# Patient Record
Sex: Male | Born: 1935 | Race: White | Hispanic: No | Marital: Married | Smoking: Never smoker
Health system: Southern US, Community
[De-identification: ages and names within clinical notes are randomized; demographics above are authoritative.]

## PROBLEM LIST (undated history)

## (undated) DIAGNOSIS — R011 Cardiac murmur, unspecified: Secondary | ICD-10-CM

## (undated) DIAGNOSIS — I4891 Unspecified atrial fibrillation: Secondary | ICD-10-CM

## (undated) DIAGNOSIS — I499 Cardiac arrhythmia, unspecified: Secondary | ICD-10-CM

## (undated) DIAGNOSIS — G4730 Sleep apnea, unspecified: Secondary | ICD-10-CM

## (undated) DIAGNOSIS — I10 Essential (primary) hypertension: Secondary | ICD-10-CM

## (undated) DIAGNOSIS — N400 Enlarged prostate without lower urinary tract symptoms: Secondary | ICD-10-CM

## (undated) HISTORY — DX: Cardiac murmur, unspecified: R01.1

## (undated) HISTORY — DX: Cardiac arrhythmia, unspecified: I49.9

## (undated) HISTORY — DX: Benign prostatic hyperplasia without lower urinary tract symptoms: N40.0

## (undated) HISTORY — PX: HERNIA REPAIR: SHX51

## (undated) HISTORY — DX: Unspecified atrial fibrillation: I48.91

---

## 2005-01-11 ENCOUNTER — Inpatient Hospital Stay: Payer: Self-pay | Admitting: Internal Medicine

## 2005-04-13 ENCOUNTER — Inpatient Hospital Stay: Payer: Self-pay | Admitting: Internal Medicine

## 2007-05-07 ENCOUNTER — Ambulatory Visit: Payer: Self-pay | Admitting: Urology

## 2011-05-22 ENCOUNTER — Ambulatory Visit: Payer: Self-pay | Admitting: Family Medicine

## 2011-12-24 ENCOUNTER — Ambulatory Visit: Payer: Self-pay | Admitting: Internal Medicine

## 2012-08-06 ENCOUNTER — Emergency Department: Payer: Self-pay | Admitting: Emergency Medicine

## 2013-04-18 ENCOUNTER — Emergency Department: Payer: Self-pay | Admitting: Emergency Medicine

## 2013-04-18 LAB — COMPREHENSIVE METABOLIC PANEL
Alkaline Phosphatase: 103 U/L (ref 50–136)
BUN: 16 mg/dL (ref 7–18)
Bilirubin,Total: 1.1 mg/dL — ABNORMAL HIGH (ref 0.2–1.0)
Calcium, Total: 8.5 mg/dL (ref 8.5–10.1)
Creatinine: 1.02 mg/dL (ref 0.60–1.30)
EGFR (African American): >60
EGFR (Non-African Amer.): >60
Glucose: 152 mg/dL — ABNORMAL HIGH (ref 65–99)
Potassium: 3.7 mmol/L (ref 3.5–5.1)
SGOT(AST): 24 U/L (ref 15–37)
SGPT (ALT): 24 U/L (ref 12–78)
Total Protein: 6.7 g/dL (ref 6.4–8.2)

## 2013-04-18 LAB — TROPONIN I: Troponin-I: 0.03 ng/mL

## 2013-04-18 LAB — CBC
HCT: 47.1 % (ref 40.0–52.0)
MCH: 30.6 pg (ref 26.0–34.0)
MCHC: 34.8 g/dL (ref 32.0–36.0)
RBC: 5.35 10*6/uL (ref 4.40–5.90)
RDW: 13.4 % (ref 11.5–14.5)

## 2013-04-18 LAB — PRO B NATRIURETIC PEPTIDE: B-Type Natriuretic Peptide: 125 pg/mL (ref 0–450)

## 2013-05-06 DIAGNOSIS — N401 Enlarged prostate with lower urinary tract symptoms: Secondary | ICD-10-CM | POA: Insufficient documentation

## 2013-05-06 DIAGNOSIS — N411 Chronic prostatitis: Secondary | ICD-10-CM | POA: Insufficient documentation

## 2013-05-06 DIAGNOSIS — R972 Elevated prostate specific antigen [PSA]: Secondary | ICD-10-CM | POA: Insufficient documentation

## 2013-05-06 DIAGNOSIS — N529 Male erectile dysfunction, unspecified: Secondary | ICD-10-CM | POA: Insufficient documentation

## 2013-05-06 DIAGNOSIS — R339 Retention of urine, unspecified: Secondary | ICD-10-CM | POA: Insufficient documentation

## 2013-05-06 DIAGNOSIS — N138 Other obstructive and reflux uropathy: Secondary | ICD-10-CM | POA: Insufficient documentation

## 2014-06-23 ENCOUNTER — Emergency Department: Payer: Self-pay | Admitting: Emergency Medicine

## 2014-06-23 LAB — CBC
HCT: 49.1 % (ref 40.0–52.0)
HGB: 16.5 g/dL (ref 13.0–18.0)
MCH: 30.9 pg (ref 26.0–34.0)
MCHC: 33.6 g/dL (ref 32.0–36.0)
MCV: 92 fL (ref 80–100)
Platelet: 140 10*3/uL — ABNORMAL LOW (ref 150–440)
RBC: 5.35 10*6/uL (ref 4.40–5.90)
RDW: 13.7 % (ref 11.5–14.5)
WBC: 16.1 10*3/uL — AB (ref 3.8–10.6)

## 2014-06-23 LAB — COMPREHENSIVE METABOLIC PANEL
ANION GAP: 9 (ref 7–16)
AST: 15 U/L (ref 15–37)
Albumin: 3.6 g/dL (ref 3.4–5.0)
Alkaline Phosphatase: 85 U/L
BUN: 18 mg/dL (ref 7–18)
Bilirubin,Total: 3.9 mg/dL — ABNORMAL HIGH (ref 0.2–1.0)
CALCIUM: 8.7 mg/dL (ref 8.5–10.1)
CHLORIDE: 107 mmol/L (ref 98–107)
Co2: 24 mmol/L (ref 21–32)
Creatinine: 1.11 mg/dL (ref 0.60–1.30)
EGFR (African American): >60
Glucose: 126 mg/dL — ABNORMAL HIGH (ref 65–99)
OSMOLALITY: 283 (ref 275–301)
Potassium: 3.3 mmol/L — ABNORMAL LOW (ref 3.5–5.1)
SGPT (ALT): 23 U/L
Sodium: 140 mmol/L (ref 136–145)
Total Protein: 6.8 g/dL (ref 6.4–8.2)

## 2014-06-23 LAB — URINALYSIS, COMPLETE
BILIRUBIN, UR: NEGATIVE
GLUCOSE, UR: NEGATIVE mg/dL (ref 0–75)
KETONE: NEGATIVE
Nitrite: NEGATIVE
Ph: 5 (ref 4.5–8.0)
Protein: 30
RBC,UR: 60 /HPF (ref 0–5)
SQUAMOUS EPITHELIAL: NONE SEEN
Specific Gravity: 1.012 (ref 1.003–1.030)

## 2014-07-04 ENCOUNTER — Ambulatory Visit: Payer: Self-pay | Admitting: Internal Medicine

## 2014-07-13 DIAGNOSIS — N3001 Acute cystitis with hematuria: Secondary | ICD-10-CM | POA: Insufficient documentation

## 2014-08-18 DIAGNOSIS — R3129 Other microscopic hematuria: Secondary | ICD-10-CM | POA: Insufficient documentation

## 2014-08-18 DIAGNOSIS — R102 Pelvic and perineal pain: Secondary | ICD-10-CM | POA: Insufficient documentation

## 2014-08-18 DIAGNOSIS — R1024 Suprapubic pain: Secondary | ICD-10-CM | POA: Insufficient documentation

## 2014-08-21 ENCOUNTER — Emergency Department: Payer: Self-pay | Admitting: Emergency Medicine

## 2014-08-21 LAB — URINALYSIS, COMPLETE
Bilirubin,UR: NEGATIVE
GLUCOSE, UR: NEGATIVE mg/dL (ref 0–75)
KETONE: NEGATIVE
NITRITE: NEGATIVE
PROTEIN: NEGATIVE
Ph: 7 (ref 4.5–8.0)
SQUAMOUS EPITHELIAL: NONE SEEN
Specific Gravity: 1.005 (ref 1.003–1.030)

## 2014-08-21 LAB — COMPREHENSIVE METABOLIC PANEL
ANION GAP: 6 — AB (ref 7–16)
AST: 16 U/L (ref 15–37)
Albumin: 3 g/dL — ABNORMAL LOW (ref 3.4–5.0)
Alkaline Phosphatase: 82 U/L
BUN: 11 mg/dL (ref 7–18)
Bilirubin,Total: 1.4 mg/dL — ABNORMAL HIGH (ref 0.2–1.0)
CHLORIDE: 105 mmol/L (ref 98–107)
CO2: 29 mmol/L (ref 21–32)
Calcium, Total: 7.9 mg/dL — ABNORMAL LOW (ref 8.5–10.1)
Creatinine: 1.14 mg/dL (ref 0.60–1.30)
EGFR (African American): >60
Glucose: 121 mg/dL — ABNORMAL HIGH (ref 65–99)
Osmolality: 280 (ref 275–301)
POTASSIUM: 3.6 mmol/L (ref 3.5–5.1)
SGPT (ALT): 19 U/L
SODIUM: 140 mmol/L (ref 136–145)
TOTAL PROTEIN: 6.6 g/dL (ref 6.4–8.2)

## 2014-08-21 LAB — CBC
HCT: 46.6 % (ref 40.0–52.0)
HGB: 16.1 g/dL (ref 13.0–18.0)
MCH: 30.6 pg (ref 26.0–34.0)
MCHC: 34.5 g/dL (ref 32.0–36.0)
MCV: 89 fL (ref 80–100)
Platelet: 159 10*3/uL (ref 150–440)
RBC: 5.25 10*6/uL (ref 4.40–5.90)
RDW: 13.4 % (ref 11.5–14.5)
WBC: 5.1 10*3/uL (ref 3.8–10.6)

## 2014-08-21 LAB — TROPONIN I: Troponin-I: <0.02 ng/mL

## 2014-08-25 DIAGNOSIS — N281 Cyst of kidney, acquired: Secondary | ICD-10-CM | POA: Insufficient documentation

## 2014-08-25 DIAGNOSIS — K469 Unspecified abdominal hernia without obstruction or gangrene: Secondary | ICD-10-CM | POA: Insufficient documentation

## 2015-12-14 DIAGNOSIS — H2513 Age-related nuclear cataract, bilateral: Secondary | ICD-10-CM | POA: Diagnosis not present

## 2015-12-21 DIAGNOSIS — S60450A Superficial foreign body of right index finger, initial encounter: Secondary | ICD-10-CM | POA: Diagnosis not present

## 2016-05-24 DIAGNOSIS — R011 Cardiac murmur, unspecified: Secondary | ICD-10-CM | POA: Diagnosis not present

## 2016-05-24 DIAGNOSIS — R42 Dizziness and giddiness: Secondary | ICD-10-CM | POA: Diagnosis not present

## 2016-05-24 DIAGNOSIS — E784 Other hyperlipidemia: Secondary | ICD-10-CM | POA: Diagnosis not present

## 2016-05-24 DIAGNOSIS — J329 Chronic sinusitis, unspecified: Secondary | ICD-10-CM | POA: Diagnosis not present

## 2016-05-24 DIAGNOSIS — I480 Paroxysmal atrial fibrillation: Secondary | ICD-10-CM | POA: Diagnosis not present

## 2016-05-24 DIAGNOSIS — G4733 Obstructive sleep apnea (adult) (pediatric): Secondary | ICD-10-CM | POA: Diagnosis not present

## 2016-05-24 DIAGNOSIS — I10 Essential (primary) hypertension: Secondary | ICD-10-CM | POA: Diagnosis not present

## 2016-05-28 DIAGNOSIS — L570 Actinic keratosis: Secondary | ICD-10-CM | POA: Diagnosis not present

## 2016-05-28 DIAGNOSIS — Z08 Encounter for follow-up examination after completed treatment for malignant neoplasm: Secondary | ICD-10-CM | POA: Diagnosis not present

## 2016-05-28 DIAGNOSIS — X32XXXA Exposure to sunlight, initial encounter: Secondary | ICD-10-CM | POA: Diagnosis not present

## 2016-05-28 DIAGNOSIS — Z85828 Personal history of other malignant neoplasm of skin: Secondary | ICD-10-CM | POA: Diagnosis not present

## 2016-05-28 DIAGNOSIS — L821 Other seborrheic keratosis: Secondary | ICD-10-CM | POA: Diagnosis not present

## 2016-06-07 DIAGNOSIS — R0602 Shortness of breath: Secondary | ICD-10-CM | POA: Diagnosis not present

## 2016-06-07 DIAGNOSIS — R011 Cardiac murmur, unspecified: Secondary | ICD-10-CM | POA: Diagnosis not present

## 2016-06-12 DIAGNOSIS — N411 Chronic prostatitis: Secondary | ICD-10-CM | POA: Diagnosis not present

## 2016-06-12 DIAGNOSIS — N138 Other obstructive and reflux uropathy: Secondary | ICD-10-CM | POA: Diagnosis not present

## 2016-06-12 DIAGNOSIS — R3129 Other microscopic hematuria: Secondary | ICD-10-CM | POA: Diagnosis not present

## 2016-06-12 DIAGNOSIS — N401 Enlarged prostate with lower urinary tract symptoms: Secondary | ICD-10-CM | POA: Diagnosis not present

## 2016-06-12 DIAGNOSIS — R339 Retention of urine, unspecified: Secondary | ICD-10-CM | POA: Diagnosis not present

## 2016-06-12 DIAGNOSIS — R972 Elevated prostate specific antigen [PSA]: Secondary | ICD-10-CM | POA: Diagnosis not present

## 2016-07-19 DIAGNOSIS — Z0000 Encounter for general adult medical examination without abnormal findings: Secondary | ICD-10-CM | POA: Diagnosis not present

## 2016-07-19 DIAGNOSIS — R7309 Other abnormal glucose: Secondary | ICD-10-CM | POA: Diagnosis not present

## 2016-07-19 DIAGNOSIS — I480 Paroxysmal atrial fibrillation: Secondary | ICD-10-CM | POA: Diagnosis not present

## 2016-07-19 DIAGNOSIS — E782 Mixed hyperlipidemia: Secondary | ICD-10-CM | POA: Diagnosis not present

## 2016-07-19 DIAGNOSIS — G4733 Obstructive sleep apnea (adult) (pediatric): Secondary | ICD-10-CM | POA: Diagnosis not present

## 2016-07-19 DIAGNOSIS — Z79899 Other long term (current) drug therapy: Secondary | ICD-10-CM | POA: Diagnosis not present

## 2016-07-19 DIAGNOSIS — I10 Essential (primary) hypertension: Secondary | ICD-10-CM | POA: Diagnosis not present

## 2016-07-19 DIAGNOSIS — Z1211 Encounter for screening for malignant neoplasm of colon: Secondary | ICD-10-CM | POA: Diagnosis not present

## 2016-07-23 DIAGNOSIS — E782 Mixed hyperlipidemia: Secondary | ICD-10-CM | POA: Diagnosis not present

## 2016-07-23 DIAGNOSIS — R7309 Other abnormal glucose: Secondary | ICD-10-CM | POA: Diagnosis not present

## 2016-07-23 DIAGNOSIS — I10 Essential (primary) hypertension: Secondary | ICD-10-CM | POA: Diagnosis not present

## 2016-07-23 DIAGNOSIS — Z79899 Other long term (current) drug therapy: Secondary | ICD-10-CM | POA: Diagnosis not present

## 2016-07-26 DIAGNOSIS — Z1211 Encounter for screening for malignant neoplasm of colon: Secondary | ICD-10-CM | POA: Diagnosis not present

## 2016-08-05 DIAGNOSIS — G4733 Obstructive sleep apnea (adult) (pediatric): Secondary | ICD-10-CM | POA: Diagnosis not present

## 2016-08-05 DIAGNOSIS — I10 Essential (primary) hypertension: Secondary | ICD-10-CM | POA: Diagnosis not present

## 2016-08-15 DIAGNOSIS — G4733 Obstructive sleep apnea (adult) (pediatric): Secondary | ICD-10-CM | POA: Diagnosis not present

## 2016-08-15 DIAGNOSIS — I10 Essential (primary) hypertension: Secondary | ICD-10-CM | POA: Diagnosis not present

## 2016-08-18 DIAGNOSIS — G4733 Obstructive sleep apnea (adult) (pediatric): Secondary | ICD-10-CM | POA: Diagnosis not present

## 2016-09-15 DIAGNOSIS — I10 Essential (primary) hypertension: Secondary | ICD-10-CM | POA: Diagnosis not present

## 2016-09-15 DIAGNOSIS — G4733 Obstructive sleep apnea (adult) (pediatric): Secondary | ICD-10-CM | POA: Diagnosis not present

## 2016-10-03 ENCOUNTER — Ambulatory Visit: Payer: PPO | Attending: Specialist

## 2016-10-03 DIAGNOSIS — G4733 Obstructive sleep apnea (adult) (pediatric): Secondary | ICD-10-CM | POA: Insufficient documentation

## 2016-10-03 DIAGNOSIS — G4761 Periodic limb movement disorder: Secondary | ICD-10-CM | POA: Insufficient documentation

## 2016-10-15 DIAGNOSIS — G4733 Obstructive sleep apnea (adult) (pediatric): Secondary | ICD-10-CM | POA: Diagnosis not present

## 2016-10-15 DIAGNOSIS — I10 Essential (primary) hypertension: Secondary | ICD-10-CM | POA: Diagnosis not present

## 2016-11-15 DIAGNOSIS — I10 Essential (primary) hypertension: Secondary | ICD-10-CM | POA: Diagnosis not present

## 2016-11-15 DIAGNOSIS — G4733 Obstructive sleep apnea (adult) (pediatric): Secondary | ICD-10-CM | POA: Diagnosis not present

## 2016-12-16 DIAGNOSIS — G4733 Obstructive sleep apnea (adult) (pediatric): Secondary | ICD-10-CM | POA: Diagnosis not present

## 2016-12-16 DIAGNOSIS — E782 Mixed hyperlipidemia: Secondary | ICD-10-CM | POA: Diagnosis not present

## 2016-12-16 DIAGNOSIS — I10 Essential (primary) hypertension: Secondary | ICD-10-CM | POA: Diagnosis not present

## 2017-01-13 DIAGNOSIS — I10 Essential (primary) hypertension: Secondary | ICD-10-CM | POA: Diagnosis not present

## 2017-01-13 DIAGNOSIS — G4733 Obstructive sleep apnea (adult) (pediatric): Secondary | ICD-10-CM | POA: Diagnosis not present

## 2017-02-13 DIAGNOSIS — I10 Essential (primary) hypertension: Secondary | ICD-10-CM | POA: Diagnosis not present

## 2017-02-13 DIAGNOSIS — G4733 Obstructive sleep apnea (adult) (pediatric): Secondary | ICD-10-CM | POA: Diagnosis not present

## 2017-03-15 DIAGNOSIS — I10 Essential (primary) hypertension: Secondary | ICD-10-CM | POA: Diagnosis not present

## 2017-03-15 DIAGNOSIS — G4733 Obstructive sleep apnea (adult) (pediatric): Secondary | ICD-10-CM | POA: Diagnosis not present

## 2017-04-15 DIAGNOSIS — G4733 Obstructive sleep apnea (adult) (pediatric): Secondary | ICD-10-CM | POA: Diagnosis not present

## 2017-04-15 DIAGNOSIS — I10 Essential (primary) hypertension: Secondary | ICD-10-CM | POA: Diagnosis not present

## 2017-05-15 DIAGNOSIS — I10 Essential (primary) hypertension: Secondary | ICD-10-CM | POA: Diagnosis not present

## 2017-05-15 DIAGNOSIS — G4733 Obstructive sleep apnea (adult) (pediatric): Secondary | ICD-10-CM | POA: Diagnosis not present

## 2017-05-28 DIAGNOSIS — X32XXXA Exposure to sunlight, initial encounter: Secondary | ICD-10-CM | POA: Diagnosis not present

## 2017-05-28 DIAGNOSIS — D2272 Melanocytic nevi of left lower limb, including hip: Secondary | ICD-10-CM | POA: Diagnosis not present

## 2017-05-28 DIAGNOSIS — R208 Other disturbances of skin sensation: Secondary | ICD-10-CM | POA: Diagnosis not present

## 2017-05-28 DIAGNOSIS — D225 Melanocytic nevi of trunk: Secondary | ICD-10-CM | POA: Diagnosis not present

## 2017-05-28 DIAGNOSIS — D485 Neoplasm of uncertain behavior of skin: Secondary | ICD-10-CM | POA: Diagnosis not present

## 2017-05-28 DIAGNOSIS — D2261 Melanocytic nevi of right upper limb, including shoulder: Secondary | ICD-10-CM | POA: Diagnosis not present

## 2017-05-28 DIAGNOSIS — Z85828 Personal history of other malignant neoplasm of skin: Secondary | ICD-10-CM | POA: Diagnosis not present

## 2017-05-28 DIAGNOSIS — B079 Viral wart, unspecified: Secondary | ICD-10-CM | POA: Diagnosis not present

## 2017-05-28 DIAGNOSIS — L570 Actinic keratosis: Secondary | ICD-10-CM | POA: Diagnosis not present

## 2017-05-29 DIAGNOSIS — J329 Chronic sinusitis, unspecified: Secondary | ICD-10-CM | POA: Diagnosis not present

## 2017-05-29 DIAGNOSIS — R0602 Shortness of breath: Secondary | ICD-10-CM | POA: Diagnosis not present

## 2017-05-29 DIAGNOSIS — R011 Cardiac murmur, unspecified: Secondary | ICD-10-CM | POA: Diagnosis not present

## 2017-05-29 DIAGNOSIS — E784 Other hyperlipidemia: Secondary | ICD-10-CM | POA: Diagnosis not present

## 2017-05-29 DIAGNOSIS — G4733 Obstructive sleep apnea (adult) (pediatric): Secondary | ICD-10-CM | POA: Diagnosis not present

## 2017-05-29 DIAGNOSIS — I480 Paroxysmal atrial fibrillation: Secondary | ICD-10-CM | POA: Diagnosis not present

## 2017-05-29 DIAGNOSIS — I10 Essential (primary) hypertension: Secondary | ICD-10-CM | POA: Diagnosis not present

## 2017-05-29 DIAGNOSIS — R42 Dizziness and giddiness: Secondary | ICD-10-CM | POA: Diagnosis not present

## 2017-06-15 DIAGNOSIS — G4733 Obstructive sleep apnea (adult) (pediatric): Secondary | ICD-10-CM | POA: Diagnosis not present

## 2017-06-15 DIAGNOSIS — I10 Essential (primary) hypertension: Secondary | ICD-10-CM | POA: Diagnosis not present

## 2017-07-16 DIAGNOSIS — G4733 Obstructive sleep apnea (adult) (pediatric): Secondary | ICD-10-CM | POA: Diagnosis not present

## 2017-07-16 DIAGNOSIS — I10 Essential (primary) hypertension: Secondary | ICD-10-CM | POA: Diagnosis not present

## 2017-07-29 DIAGNOSIS — R3129 Other microscopic hematuria: Secondary | ICD-10-CM | POA: Diagnosis not present

## 2017-07-29 DIAGNOSIS — N138 Other obstructive and reflux uropathy: Secondary | ICD-10-CM | POA: Diagnosis not present

## 2017-07-29 DIAGNOSIS — R339 Retention of urine, unspecified: Secondary | ICD-10-CM | POA: Diagnosis not present

## 2017-07-29 DIAGNOSIS — R972 Elevated prostate specific antigen [PSA]: Secondary | ICD-10-CM | POA: Diagnosis not present

## 2017-07-29 DIAGNOSIS — N411 Chronic prostatitis: Secondary | ICD-10-CM | POA: Diagnosis not present

## 2017-07-29 DIAGNOSIS — N401 Enlarged prostate with lower urinary tract symptoms: Secondary | ICD-10-CM | POA: Diagnosis not present

## 2017-08-15 DIAGNOSIS — G4733 Obstructive sleep apnea (adult) (pediatric): Secondary | ICD-10-CM | POA: Diagnosis not present

## 2017-08-15 DIAGNOSIS — I10 Essential (primary) hypertension: Secondary | ICD-10-CM | POA: Diagnosis not present

## 2017-08-19 DIAGNOSIS — Z79899 Other long term (current) drug therapy: Secondary | ICD-10-CM | POA: Diagnosis not present

## 2017-08-19 DIAGNOSIS — I10 Essential (primary) hypertension: Secondary | ICD-10-CM | POA: Diagnosis not present

## 2017-08-19 DIAGNOSIS — Z125 Encounter for screening for malignant neoplasm of prostate: Secondary | ICD-10-CM | POA: Diagnosis not present

## 2017-08-19 DIAGNOSIS — E782 Mixed hyperlipidemia: Secondary | ICD-10-CM | POA: Diagnosis not present

## 2017-08-19 DIAGNOSIS — Z1211 Encounter for screening for malignant neoplasm of colon: Secondary | ICD-10-CM | POA: Diagnosis not present

## 2017-08-19 DIAGNOSIS — I480 Paroxysmal atrial fibrillation: Secondary | ICD-10-CM | POA: Diagnosis not present

## 2017-08-19 DIAGNOSIS — R7309 Other abnormal glucose: Secondary | ICD-10-CM | POA: Diagnosis not present

## 2017-08-19 DIAGNOSIS — G4733 Obstructive sleep apnea (adult) (pediatric): Secondary | ICD-10-CM | POA: Diagnosis not present

## 2017-08-22 DIAGNOSIS — Z1211 Encounter for screening for malignant neoplasm of colon: Secondary | ICD-10-CM | POA: Diagnosis not present

## 2017-09-08 DIAGNOSIS — Z9989 Dependence on other enabling machines and devices: Secondary | ICD-10-CM | POA: Diagnosis not present

## 2017-09-08 DIAGNOSIS — J449 Chronic obstructive pulmonary disease, unspecified: Secondary | ICD-10-CM | POA: Diagnosis not present

## 2017-09-08 DIAGNOSIS — G4733 Obstructive sleep apnea (adult) (pediatric): Secondary | ICD-10-CM | POA: Diagnosis not present

## 2017-09-08 DIAGNOSIS — R195 Other fecal abnormalities: Secondary | ICD-10-CM | POA: Diagnosis not present

## 2017-09-15 DIAGNOSIS — G4733 Obstructive sleep apnea (adult) (pediatric): Secondary | ICD-10-CM | POA: Diagnosis not present

## 2017-09-15 DIAGNOSIS — I10 Essential (primary) hypertension: Secondary | ICD-10-CM | POA: Diagnosis not present

## 2017-10-15 DIAGNOSIS — G4733 Obstructive sleep apnea (adult) (pediatric): Secondary | ICD-10-CM | POA: Diagnosis not present

## 2017-10-15 DIAGNOSIS — I10 Essential (primary) hypertension: Secondary | ICD-10-CM | POA: Diagnosis not present

## 2017-10-23 DIAGNOSIS — J449 Chronic obstructive pulmonary disease, unspecified: Secondary | ICD-10-CM | POA: Diagnosis not present

## 2017-10-23 DIAGNOSIS — G4733 Obstructive sleep apnea (adult) (pediatric): Secondary | ICD-10-CM | POA: Diagnosis not present

## 2017-11-13 ENCOUNTER — Ambulatory Visit: Payer: PPO | Attending: Specialist

## 2017-11-13 DIAGNOSIS — G4733 Obstructive sleep apnea (adult) (pediatric): Secondary | ICD-10-CM | POA: Diagnosis not present

## 2017-11-13 DIAGNOSIS — G4761 Periodic limb movement disorder: Secondary | ICD-10-CM | POA: Diagnosis not present

## 2017-11-15 DIAGNOSIS — G4733 Obstructive sleep apnea (adult) (pediatric): Secondary | ICD-10-CM | POA: Diagnosis not present

## 2017-11-15 DIAGNOSIS — I10 Essential (primary) hypertension: Secondary | ICD-10-CM | POA: Diagnosis not present

## 2017-12-22 DIAGNOSIS — I10 Essential (primary) hypertension: Secondary | ICD-10-CM | POA: Diagnosis not present

## 2017-12-22 DIAGNOSIS — E782 Mixed hyperlipidemia: Secondary | ICD-10-CM | POA: Diagnosis not present

## 2017-12-22 DIAGNOSIS — G4733 Obstructive sleep apnea (adult) (pediatric): Secondary | ICD-10-CM | POA: Diagnosis not present

## 2017-12-22 DIAGNOSIS — I480 Paroxysmal atrial fibrillation: Secondary | ICD-10-CM | POA: Diagnosis not present

## 2017-12-22 DIAGNOSIS — Z0000 Encounter for general adult medical examination without abnormal findings: Secondary | ICD-10-CM | POA: Diagnosis not present

## 2017-12-29 DIAGNOSIS — G4733 Obstructive sleep apnea (adult) (pediatric): Secondary | ICD-10-CM | POA: Diagnosis not present

## 2018-01-26 DIAGNOSIS — J449 Chronic obstructive pulmonary disease, unspecified: Secondary | ICD-10-CM | POA: Diagnosis not present

## 2018-01-26 DIAGNOSIS — G4733 Obstructive sleep apnea (adult) (pediatric): Secondary | ICD-10-CM | POA: Diagnosis not present

## 2018-02-26 DIAGNOSIS — G4733 Obstructive sleep apnea (adult) (pediatric): Secondary | ICD-10-CM | POA: Diagnosis not present

## 2018-03-18 DIAGNOSIS — R0602 Shortness of breath: Secondary | ICD-10-CM | POA: Diagnosis not present

## 2018-03-18 DIAGNOSIS — R42 Dizziness and giddiness: Secondary | ICD-10-CM | POA: Diagnosis not present

## 2018-03-18 DIAGNOSIS — E7849 Other hyperlipidemia: Secondary | ICD-10-CM | POA: Diagnosis not present

## 2018-03-18 DIAGNOSIS — I10 Essential (primary) hypertension: Secondary | ICD-10-CM | POA: Diagnosis not present

## 2018-03-18 DIAGNOSIS — R002 Palpitations: Secondary | ICD-10-CM | POA: Diagnosis not present

## 2018-03-18 DIAGNOSIS — G4733 Obstructive sleep apnea (adult) (pediatric): Secondary | ICD-10-CM | POA: Diagnosis not present

## 2018-03-18 DIAGNOSIS — I480 Paroxysmal atrial fibrillation: Secondary | ICD-10-CM | POA: Diagnosis not present

## 2018-03-18 DIAGNOSIS — R011 Cardiac murmur, unspecified: Secondary | ICD-10-CM | POA: Diagnosis not present

## 2018-03-18 DIAGNOSIS — J329 Chronic sinusitis, unspecified: Secondary | ICD-10-CM | POA: Diagnosis not present

## 2018-03-25 DIAGNOSIS — G4733 Obstructive sleep apnea (adult) (pediatric): Secondary | ICD-10-CM | POA: Diagnosis not present

## 2018-05-13 DIAGNOSIS — I10 Essential (primary) hypertension: Secondary | ICD-10-CM | POA: Diagnosis not present

## 2018-05-13 DIAGNOSIS — E7849 Other hyperlipidemia: Secondary | ICD-10-CM | POA: Diagnosis not present

## 2018-05-13 DIAGNOSIS — R002 Palpitations: Secondary | ICD-10-CM | POA: Diagnosis not present

## 2018-05-13 DIAGNOSIS — I480 Paroxysmal atrial fibrillation: Secondary | ICD-10-CM | POA: Diagnosis not present

## 2018-05-13 DIAGNOSIS — R42 Dizziness and giddiness: Secondary | ICD-10-CM | POA: Diagnosis not present

## 2018-05-13 DIAGNOSIS — G4733 Obstructive sleep apnea (adult) (pediatric): Secondary | ICD-10-CM | POA: Diagnosis not present

## 2018-05-13 DIAGNOSIS — R011 Cardiac murmur, unspecified: Secondary | ICD-10-CM | POA: Diagnosis not present

## 2018-05-13 DIAGNOSIS — R0602 Shortness of breath: Secondary | ICD-10-CM | POA: Diagnosis not present

## 2018-05-13 DIAGNOSIS — J329 Chronic sinusitis, unspecified: Secondary | ICD-10-CM | POA: Diagnosis not present

## 2018-07-23 DIAGNOSIS — G4733 Obstructive sleep apnea (adult) (pediatric): Secondary | ICD-10-CM | POA: Diagnosis not present

## 2018-07-29 DIAGNOSIS — G4733 Obstructive sleep apnea (adult) (pediatric): Secondary | ICD-10-CM | POA: Diagnosis not present

## 2018-08-11 DIAGNOSIS — D2262 Melanocytic nevi of left upper limb, including shoulder: Secondary | ICD-10-CM | POA: Diagnosis not present

## 2018-08-11 DIAGNOSIS — L821 Other seborrheic keratosis: Secondary | ICD-10-CM | POA: Diagnosis not present

## 2018-08-11 DIAGNOSIS — L820 Inflamed seborrheic keratosis: Secondary | ICD-10-CM | POA: Diagnosis not present

## 2018-08-11 DIAGNOSIS — Z85828 Personal history of other malignant neoplasm of skin: Secondary | ICD-10-CM | POA: Diagnosis not present

## 2018-08-11 DIAGNOSIS — X32XXXA Exposure to sunlight, initial encounter: Secondary | ICD-10-CM | POA: Diagnosis not present

## 2018-08-11 DIAGNOSIS — L538 Other specified erythematous conditions: Secondary | ICD-10-CM | POA: Diagnosis not present

## 2018-08-11 DIAGNOSIS — D2261 Melanocytic nevi of right upper limb, including shoulder: Secondary | ICD-10-CM | POA: Diagnosis not present

## 2018-08-11 DIAGNOSIS — D2272 Melanocytic nevi of left lower limb, including hip: Secondary | ICD-10-CM | POA: Diagnosis not present

## 2018-08-11 DIAGNOSIS — L298 Other pruritus: Secondary | ICD-10-CM | POA: Diagnosis not present

## 2018-08-11 DIAGNOSIS — L570 Actinic keratosis: Secondary | ICD-10-CM | POA: Diagnosis not present

## 2018-08-13 DIAGNOSIS — G4733 Obstructive sleep apnea (adult) (pediatric): Secondary | ICD-10-CM | POA: Diagnosis not present

## 2018-08-13 DIAGNOSIS — E7849 Other hyperlipidemia: Secondary | ICD-10-CM | POA: Diagnosis not present

## 2018-08-13 DIAGNOSIS — J329 Chronic sinusitis, unspecified: Secondary | ICD-10-CM | POA: Diagnosis not present

## 2018-08-13 DIAGNOSIS — R002 Palpitations: Secondary | ICD-10-CM | POA: Diagnosis not present

## 2018-08-13 DIAGNOSIS — I480 Paroxysmal atrial fibrillation: Secondary | ICD-10-CM | POA: Diagnosis not present

## 2018-08-13 DIAGNOSIS — I10 Essential (primary) hypertension: Secondary | ICD-10-CM | POA: Diagnosis not present

## 2018-08-13 DIAGNOSIS — R011 Cardiac murmur, unspecified: Secondary | ICD-10-CM | POA: Diagnosis not present

## 2018-08-13 DIAGNOSIS — R42 Dizziness and giddiness: Secondary | ICD-10-CM | POA: Diagnosis not present

## 2018-08-13 DIAGNOSIS — R0602 Shortness of breath: Secondary | ICD-10-CM | POA: Diagnosis not present

## 2018-08-28 DIAGNOSIS — G4733 Obstructive sleep apnea (adult) (pediatric): Secondary | ICD-10-CM | POA: Diagnosis not present

## 2018-09-08 DIAGNOSIS — K625 Hemorrhage of anus and rectum: Secondary | ICD-10-CM | POA: Diagnosis not present

## 2018-09-08 DIAGNOSIS — I480 Paroxysmal atrial fibrillation: Secondary | ICD-10-CM | POA: Diagnosis not present

## 2018-09-28 DIAGNOSIS — G4733 Obstructive sleep apnea (adult) (pediatric): Secondary | ICD-10-CM | POA: Diagnosis not present

## 2018-10-28 DIAGNOSIS — G4733 Obstructive sleep apnea (adult) (pediatric): Secondary | ICD-10-CM | POA: Diagnosis not present

## 2018-11-10 ENCOUNTER — Other Ambulatory Visit: Payer: Self-pay

## 2018-11-10 DIAGNOSIS — I480 Paroxysmal atrial fibrillation: Secondary | ICD-10-CM | POA: Insufficient documentation

## 2018-11-10 DIAGNOSIS — Z87438 Personal history of other diseases of male genital organs: Secondary | ICD-10-CM | POA: Insufficient documentation

## 2018-11-10 DIAGNOSIS — I10 Essential (primary) hypertension: Secondary | ICD-10-CM | POA: Insufficient documentation

## 2018-11-10 DIAGNOSIS — Z8709 Personal history of other diseases of the respiratory system: Secondary | ICD-10-CM | POA: Insufficient documentation

## 2018-11-10 DIAGNOSIS — G4730 Sleep apnea, unspecified: Secondary | ICD-10-CM | POA: Insufficient documentation

## 2018-11-10 DIAGNOSIS — E785 Hyperlipidemia, unspecified: Secondary | ICD-10-CM | POA: Insufficient documentation

## 2018-11-11 ENCOUNTER — Ambulatory Visit (INDEPENDENT_AMBULATORY_CARE_PROVIDER_SITE_OTHER): Payer: PPO | Admitting: Urology

## 2018-11-11 ENCOUNTER — Encounter: Payer: Self-pay | Admitting: Urology

## 2018-11-11 VITALS — BP 143/91 | HR 84 | Ht 73.0 in | Wt 235.1 lb

## 2018-11-11 DIAGNOSIS — N138 Other obstructive and reflux uropathy: Secondary | ICD-10-CM | POA: Diagnosis not present

## 2018-11-11 DIAGNOSIS — Z87438 Personal history of other diseases of male genital organs: Secondary | ICD-10-CM

## 2018-11-11 DIAGNOSIS — N401 Enlarged prostate with lower urinary tract symptoms: Secondary | ICD-10-CM

## 2018-11-11 NOTE — Progress Notes (Signed)
11/11/2018 10:16 AM   Samuel Simpson [birth year] [id number]  Referring provider: Idelle Crouch, MD [facility] [place], [place] [address]  Chief Complaint  Patient presents with  . Benign Prostatic Hypertrophy  . Establish Care    HPI: Samuel Simpson is a 85 yo M with a history of BPH, and chronicprostatitis who presents today to transfer care from Dr. Jacqlyn Larsen at [facility].   -Last visit with Dr. Jacqlyn Larsen on 07/29/2017 -Denies any bothersome urinary symptoms; I PSS is 0 -Currently taking flomax every other day and finasteride; responded well to medication  -Hx of high blood pressure; takes Cardura  IPSS    Row Name 11/11/18 0900         International Prostate Symptom Score   How often have you had the sensation of not emptying your bladder?  Not at All     How often have you had to urinate less than every two hours?  Not at All     How often have you found you stopped and started again several times when you urinated?  Not at All     How often have you found it difficult to postpone urination?  Not at All     How often have you had a weak urinary stream?  Not at All     How often have you had to strain to start urination?  Not at All     How many times did you typically get up at night to urinate?  None     Total IPSS Score  0       Quality of Life due to urinary symptoms   If you were to spend the rest of your life with your urinary condition just the way it is now how would you feel about that?  Delighted        Score:  1-7 Mild 8-19 Moderate 20-35 Severe  PMH: Past Medical History:  Diagnosis Date  . Arrhythmia   . Atrial fibrillation ([place])   . Heart murmur     Surgical History: Past Surgical History:  Procedure Laterality Date  . HERNIA REPAIR      Home Medications:  Allergies as of 11/11/2018   No Known Allergies     Medication List       Accurate as of November 11, 2018 10:16 AM. Always use your most recent med list.          apixaban 5 MG Tabs tablet Commonly known as:  ELIQUIS Take by mouth.   aspirin EC 81 MG tablet Take by mouth.   doxazosin 8 MG tablet Commonly known as:  CARDURA TAKE 1/2 TABLET BY MOUTH AT BEDTIME   finasteride 5 MG tablet Commonly known as:  PROSCAR Take 5 mg by mouth.   losartan-hydrochlorothiazide 50-12.5 MG tablet Commonly known as:  HYZAAR Take by mouth.   metoprolol tartrate 25 MG tablet Commonly known as:  LOPRESSOR Take by mouth.   tamsulosin 0.4 MG Caps capsule Commonly known as:  FLOMAX Take by mouth.       Allergies: No Known Allergies  Family History: Family History  Problem Relation Age of Onset  . Heart attack Father     Social History:  reports that he has never smoked. He has never used smokeless tobacco. He reports current alcohol use. He reports that he does not use drugs.  ROS: UROLOGY Frequent Urination?: No Hard to postpone urination?: No Burning/pain with urination?: No Get up  at night to urinate?: No Leakage of urine?: No Urine stream starts and stops?: No Trouble starting stream?: No Do you have to strain to urinate?: No Blood in urine?: No Urinary tract infection?: No Sexually transmitted disease?: No Injury to kidneys or bladder?: No Painful intercourse?: No Weak stream?: No Erection problems?: Yes Penile pain?: No  Gastrointestinal Nausea?: No Vomiting?: No Indigestion/heartburn?: No Diarrhea?: No Constipation?: No  Constitutional Fever: No Night sweats?: No Weight loss?: No Fatigue?: No  Skin Skin rash/lesions?: No Itching?: No  Eyes Blurred vision?: No Double vision?: No  Ears/Nose/Throat Sore throat?: No Sinus problems?: No  Hematologic/Lymphatic Swollen glands?: No Easy bruising?: No  Cardiovascular Leg swelling?: No Chest pain?: No  Respiratory Cough?: No Shortness of breath?: No  Endocrine Excessive thirst?: No  Musculoskeletal Back pain?: No Joint pain?:  No  Neurological Headaches?: No Dizziness?: No  Psychologic Depression?: No Anxiety?: No  Physical Exam: BP (!) 143/91 (BP Location: Left Arm, Patient Position: Sitting, Cuff Size: Normal)   Pulse 84   Ht 6\' 1"  (1.854 m)   Wt 235 lb 1.6 oz (106.6 kg)   BMI 31.02 kg/m   Constitutional:  Well nourished. Alert and oriented, No acute distress. HEENT: [place] AT, moist mucus membranes.  Trachea midline, no masses. Cardiovascular: No clubbing, cyanosis, or edema. Respiratory: Normal respiratory effort, no increased work of breathing. Rectal: Patient with  normal sphincter tone. Anus and perineum without scarring or rashes. No rectal masses are appreciated. Prostate is approximately 30 grams, smooth rectal tone, no nodules are appreciated. Seminal vesicles are normal. Skin: No rashes, bruises or suspicious lesions. Neurologic: Grossly intact, no focal deficits, moving all 4 extremities. Psychiatric: Normal mood and affect.  Assessment & Plan:   1. BPH with urinary obstruction -Discontinue use of tamsulosin since pt takes Cardura for high blood pressure  -He would like to get off as many medications as possible.  Cardura was prescribed by Dr. Doy Hutching and he is not sure if he is on this for BPH, hypertension or both.  If not needed for hypertension this could also be discontinued and will defer to Dr. Doy Hutching. -Continue finasteride on a daily basis; side-effects of finasteride discussed -No bothersome urinary symptoms; I PSS is 0  Return in about 1 year (around 11/12/2019).  Samuel Simpson, Samuel Simpson [address], [place] Samuel Simpson, [place] [address] [phone]  I, Samuel Simpson, am acting as a scribe for Dr. Nicki Reaper C. Simpson,  I, Samuel Sons, MD, have reviewed all documentation for this visit. The documentation on 11/11/18 for the exam, diagnosis, procedures, and orders are all accurate and complete.

## 2018-11-28 DIAGNOSIS — G4733 Obstructive sleep apnea (adult) (pediatric): Secondary | ICD-10-CM | POA: Diagnosis not present

## 2018-12-29 DIAGNOSIS — G4733 Obstructive sleep apnea (adult) (pediatric): Secondary | ICD-10-CM | POA: Diagnosis not present

## 2019-01-06 ENCOUNTER — Other Ambulatory Visit: Payer: Self-pay

## 2019-01-06 DIAGNOSIS — Z87438 Personal history of other diseases of male genital organs: Secondary | ICD-10-CM

## 2019-01-06 DIAGNOSIS — N401 Enlarged prostate with lower urinary tract symptoms: Principal | ICD-10-CM

## 2019-01-06 DIAGNOSIS — N138 Other obstructive and reflux uropathy: Secondary | ICD-10-CM

## 2019-01-06 MED ORDER — FINASTERIDE 5 MG PO TABS: 5.0000 mg | ORAL_TABLET | Freq: Every day | ORAL | 3 refills | Status: DC

## 2019-01-27 DIAGNOSIS — G4733 Obstructive sleep apnea (adult) (pediatric): Secondary | ICD-10-CM | POA: Diagnosis not present

## 2019-02-01 DIAGNOSIS — G4733 Obstructive sleep apnea (adult) (pediatric): Secondary | ICD-10-CM | POA: Diagnosis not present

## 2019-02-01 DIAGNOSIS — J449 Chronic obstructive pulmonary disease, unspecified: Secondary | ICD-10-CM | POA: Diagnosis not present

## 2019-02-09 DIAGNOSIS — J329 Chronic sinusitis, unspecified: Secondary | ICD-10-CM | POA: Diagnosis not present

## 2019-02-09 DIAGNOSIS — E7849 Other hyperlipidemia: Secondary | ICD-10-CM | POA: Diagnosis not present

## 2019-02-09 DIAGNOSIS — G4733 Obstructive sleep apnea (adult) (pediatric): Secondary | ICD-10-CM | POA: Diagnosis not present

## 2019-02-09 DIAGNOSIS — I10 Essential (primary) hypertension: Secondary | ICD-10-CM | POA: Diagnosis not present

## 2019-02-09 DIAGNOSIS — I480 Paroxysmal atrial fibrillation: Secondary | ICD-10-CM | POA: Diagnosis not present

## 2019-02-09 DIAGNOSIS — R011 Cardiac murmur, unspecified: Secondary | ICD-10-CM | POA: Diagnosis not present

## 2019-02-09 DIAGNOSIS — R42 Dizziness and giddiness: Secondary | ICD-10-CM | POA: Diagnosis not present

## 2019-02-09 DIAGNOSIS — R002 Palpitations: Secondary | ICD-10-CM | POA: Diagnosis not present

## 2019-02-09 DIAGNOSIS — R0602 Shortness of breath: Secondary | ICD-10-CM | POA: Diagnosis not present

## 2019-04-01 DIAGNOSIS — Z08 Encounter for follow-up examination after completed treatment for malignant neoplasm: Secondary | ICD-10-CM | POA: Diagnosis not present

## 2019-04-01 DIAGNOSIS — Z85828 Personal history of other malignant neoplasm of skin: Secondary | ICD-10-CM | POA: Diagnosis not present

## 2019-04-01 DIAGNOSIS — L570 Actinic keratosis: Secondary | ICD-10-CM | POA: Diagnosis not present

## 2019-04-01 DIAGNOSIS — L821 Other seborrheic keratosis: Secondary | ICD-10-CM | POA: Diagnosis not present

## 2019-04-01 DIAGNOSIS — X32XXXA Exposure to sunlight, initial encounter: Secondary | ICD-10-CM | POA: Diagnosis not present

## 2019-08-10 DIAGNOSIS — D2271 Melanocytic nevi of right lower limb, including hip: Secondary | ICD-10-CM | POA: Diagnosis not present

## 2019-08-10 DIAGNOSIS — X32XXXA Exposure to sunlight, initial encounter: Secondary | ICD-10-CM | POA: Diagnosis not present

## 2019-08-10 DIAGNOSIS — L570 Actinic keratosis: Secondary | ICD-10-CM | POA: Diagnosis not present

## 2019-08-10 DIAGNOSIS — Z08 Encounter for follow-up examination after completed treatment for malignant neoplasm: Secondary | ICD-10-CM | POA: Diagnosis not present

## 2019-08-10 DIAGNOSIS — D2272 Melanocytic nevi of left lower limb, including hip: Secondary | ICD-10-CM | POA: Diagnosis not present

## 2019-08-10 DIAGNOSIS — D225 Melanocytic nevi of trunk: Secondary | ICD-10-CM | POA: Diagnosis not present

## 2019-08-10 DIAGNOSIS — Z85828 Personal history of other malignant neoplasm of skin: Secondary | ICD-10-CM | POA: Diagnosis not present

## 2019-08-10 DIAGNOSIS — D2261 Melanocytic nevi of right upper limb, including shoulder: Secondary | ICD-10-CM | POA: Diagnosis not present

## 2019-08-10 DIAGNOSIS — D2262 Melanocytic nevi of left upper limb, including shoulder: Secondary | ICD-10-CM | POA: Diagnosis not present

## 2019-08-10 DIAGNOSIS — L821 Other seborrheic keratosis: Secondary | ICD-10-CM | POA: Diagnosis not present

## 2019-08-11 DIAGNOSIS — R011 Cardiac murmur, unspecified: Secondary | ICD-10-CM | POA: Diagnosis not present

## 2019-08-11 DIAGNOSIS — I10 Essential (primary) hypertension: Secondary | ICD-10-CM | POA: Diagnosis not present

## 2019-08-11 DIAGNOSIS — E7849 Other hyperlipidemia: Secondary | ICD-10-CM | POA: Diagnosis not present

## 2019-08-11 DIAGNOSIS — J329 Chronic sinusitis, unspecified: Secondary | ICD-10-CM | POA: Diagnosis not present

## 2019-08-11 DIAGNOSIS — I480 Paroxysmal atrial fibrillation: Secondary | ICD-10-CM | POA: Diagnosis not present

## 2019-08-11 DIAGNOSIS — R0602 Shortness of breath: Secondary | ICD-10-CM | POA: Diagnosis not present

## 2019-08-11 DIAGNOSIS — R42 Dizziness and giddiness: Secondary | ICD-10-CM | POA: Diagnosis not present

## 2019-08-11 DIAGNOSIS — G4733 Obstructive sleep apnea (adult) (pediatric): Secondary | ICD-10-CM | POA: Diagnosis not present

## 2019-08-11 DIAGNOSIS — R002 Palpitations: Secondary | ICD-10-CM | POA: Diagnosis not present

## 2019-11-12 ENCOUNTER — Encounter: Payer: Self-pay | Admitting: Urology

## 2019-11-12 ENCOUNTER — Other Ambulatory Visit: Payer: Self-pay

## 2019-11-12 ENCOUNTER — Ambulatory Visit: Payer: PPO | Admitting: Urology

## 2019-11-12 VITALS — BP 135/89 | HR 99 | Ht 73.0 in | Wt 229.9 lb

## 2019-11-12 DIAGNOSIS — N138 Other obstructive and reflux uropathy: Secondary | ICD-10-CM | POA: Diagnosis not present

## 2019-11-12 DIAGNOSIS — N401 Enlarged prostate with lower urinary tract symptoms: Secondary | ICD-10-CM | POA: Diagnosis not present

## 2019-11-12 MED ORDER — TAMSULOSIN HCL 0.4 MG PO CAPS: 0.4000 mg | ORAL_CAPSULE | Freq: Every day | ORAL | 3 refills | Status: DC

## 2019-11-12 NOTE — Patient Instructions (Signed)

## 2019-11-12 NOTE — Progress Notes (Signed)
11/12/2019 10:43 AM   Samuel Simpson 29-Aug-1930 [id number]  Referring provider: Idelle Crouch, MD [facility] [place],  [place] [address]  Chief Complaint  Patient presents with  . Benign Prostatic Hypertrophy    Urologic history: 1.  BPH with lower urinary tract symptoms  HPI: 86 y.o. male presents for annual follow-up.  At his visit last year his tamsulosin was discontinued and he states Dr. Doy Hutching discontinued his doxazosin approximately 1 month ago.  He has seen no worsening of his lower urinary tract symptoms.  He remains on finasteride.  He has no bothersome lower urinary tract symptoms and his IPSS completed today was 0/35.  He does have a history of symptomatic inflammatory prostatitis and thinks he was put on the prostate medications for that reason although he remains asymptomatic.  He did inquire about minimally invasive options specifically UroLift.  Denies dysuria or gross hematuria.   PMH: Past Medical History:  Diagnosis Date  . Arrhythmia   . Atrial fibrillation ([place])   . BPH (benign prostatic hyperplasia)   . Heart murmur     Surgical History: Past Surgical History:  Procedure Laterality Date  . HERNIA REPAIR      Home Medications:  Allergies as of 11/12/2019   No Known Allergies     Medication List       Accurate as of November 12, 2019 10:43 AM. If you have any questions, ask your nurse or doctor.        STOP taking these medications   aspirin EC 81 MG tablet Stopped by: Abbie Sons, MD   losartan 50 MG tablet Commonly known as: COZAAR Stopped by: Abbie Sons, MD   tamsulosin 0.4 MG Caps capsule Commonly known as: FLOMAX Stopped by: Abbie Sons, MD     TAKE these medications   apixaban 5 MG Tabs tablet Commonly known as: ELIQUIS Take 5 mg by mouth daily.   doxazosin 8 MG tablet Commonly known as: CARDURA TAKE 1/2 TABLET BY MOUTH AT BEDTIME   finasteride 5 MG tablet Commonly known as:  PROSCAR Take 1 tablet (5 mg total) by mouth daily. What changed: when to take this   hydrochlorothiazide 12.5 MG tablet Commonly known as: HYDRODIURIL Take 12.5 mg by mouth daily.   losartan-hydrochlorothiazide 50-12.5 MG tablet Commonly known as: HYZAAR Take 1 tablet by mouth daily.   metoprolol tartrate 25 MG tablet Commonly known as: LOPRESSOR Take 25 mg by mouth at bedtime.       Allergies: No Known Allergies  Family History: Family History  Problem Relation Age of Onset  . Heart attack Father     Social History:  reports that he has never smoked. He has never used smokeless tobacco. He reports current alcohol use of about 7.0 standard drinks of alcohol per week. He reports that he does not use drugs.  ROS: UROLOGY Frequent Urination?: No Hard to postpone urination?: No Burning/pain with urination?: No Get up at night to urinate?: No Leakage of urine?: No Urine stream starts and stops?: No Trouble starting stream?: No Do you have to strain to urinate?: No Blood in urine?: No Urinary tract infection?: No Sexually transmitted disease?: No Injury to kidneys or bladder?: No Painful intercourse?: No Weak stream?: No Erection problems?: Yes Penile pain?: No  Gastrointestinal Nausea?: No Vomiting?: No Indigestion/heartburn?: No Diarrhea?: No Constipation?: No  Constitutional Fever: No Night sweats?: No Weight loss?: No Fatigue?: No  Skin Skin rash/lesions?: No Itching?: No  Eyes Blurred vision?: No Double vision?: No  Ears/Nose/Throat Sore throat?: No Sinus problems?: No  Hematologic/Lymphatic Swollen glands?: No Easy bruising?: No  Cardiovascular Leg swelling?: No Chest pain?: No  Respiratory Cough?: No Shortness of breath?: No  Endocrine Excessive thirst?: No  Musculoskeletal Back pain?: No Joint pain?: No  Neurological Headaches?: No Dizziness?: No  Psychologic Depression?: No Anxiety?: No  Physical Exam: BP 135/89  (BP Location: Left Arm, Patient Position: Sitting, Cuff Size: Normal)   Pulse 99   Ht 6\' 1"  (1.854 m)   Wt 229 lb 14.4 oz (104.3 kg)   BMI 30.33 kg/m   Constitutional:  Alert and oriented, No acute distress. HEENT: [place] AT, moist mucus membranes.  Trachea midline, no masses. Cardiovascular: No clubbing, cyanosis, or edema. Respiratory: Normal respiratory effort, no increased work of breathing. Skin: No rashes, bruises or suspicious lesions. Neurologic: Grossly intact, no focal deficits, moving all 4 extremities. Psychiatric: Normal mood and affect.   Assessment & Plan:    - BPH without lower urinary tract symptoms He is presently asymptomatic on finasteride however would like to discontinue as many medications as possible.  He wanted to know if he would be a candidate for UroLift however I would not recommend with him being completely asymptomatic.  I recommended he discontinue his finasteride and follow-up in 6 months.  If he does develop lower urinary tract symptoms then we can discuss UroLift at that time.  He would also like to have tamsulosin on hand should he develop recurrent symptoms prior to his follow-up and Rx was sent to pharmacy.   Abbie Sons, [place] [address], [place],  [address] [phone]

## 2019-11-13 ENCOUNTER — Encounter: Payer: Self-pay | Admitting: Urology

## 2020-02-14 DIAGNOSIS — R42 Dizziness and giddiness: Secondary | ICD-10-CM | POA: Diagnosis not present

## 2020-02-14 DIAGNOSIS — E7849 Other hyperlipidemia: Secondary | ICD-10-CM | POA: Diagnosis not present

## 2020-02-14 DIAGNOSIS — J329 Chronic sinusitis, unspecified: Secondary | ICD-10-CM | POA: Diagnosis not present

## 2020-02-14 DIAGNOSIS — I480 Paroxysmal atrial fibrillation: Secondary | ICD-10-CM | POA: Diagnosis not present

## 2020-02-14 DIAGNOSIS — J449 Chronic obstructive pulmonary disease, unspecified: Secondary | ICD-10-CM | POA: Diagnosis not present

## 2020-02-14 DIAGNOSIS — R0602 Shortness of breath: Secondary | ICD-10-CM | POA: Diagnosis not present

## 2020-02-14 DIAGNOSIS — Z7689 Persons encountering health services in other specified circumstances: Secondary | ICD-10-CM | POA: Diagnosis not present

## 2020-02-14 DIAGNOSIS — R002 Palpitations: Secondary | ICD-10-CM | POA: Diagnosis not present

## 2020-02-14 DIAGNOSIS — R200 Anesthesia of skin: Secondary | ICD-10-CM | POA: Diagnosis not present

## 2020-02-14 DIAGNOSIS — R011 Cardiac murmur, unspecified: Secondary | ICD-10-CM | POA: Diagnosis not present

## 2020-02-14 DIAGNOSIS — G4733 Obstructive sleep apnea (adult) (pediatric): Secondary | ICD-10-CM | POA: Diagnosis not present

## 2020-02-14 DIAGNOSIS — I10 Essential (primary) hypertension: Secondary | ICD-10-CM | POA: Diagnosis not present

## 2020-02-25 DIAGNOSIS — G4733 Obstructive sleep apnea (adult) (pediatric): Secondary | ICD-10-CM | POA: Diagnosis not present

## 2020-02-25 DIAGNOSIS — Z79899 Other long term (current) drug therapy: Secondary | ICD-10-CM | POA: Diagnosis not present

## 2020-02-25 DIAGNOSIS — N401 Enlarged prostate with lower urinary tract symptoms: Secondary | ICD-10-CM | POA: Diagnosis not present

## 2020-02-25 DIAGNOSIS — E782 Mixed hyperlipidemia: Secondary | ICD-10-CM | POA: Diagnosis not present

## 2020-02-25 DIAGNOSIS — Z0000 Encounter for general adult medical examination without abnormal findings: Secondary | ICD-10-CM | POA: Diagnosis not present

## 2020-02-25 DIAGNOSIS — I480 Paroxysmal atrial fibrillation: Secondary | ICD-10-CM | POA: Diagnosis not present

## 2020-02-25 DIAGNOSIS — R7309 Other abnormal glucose: Secondary | ICD-10-CM | POA: Diagnosis not present

## 2020-02-25 DIAGNOSIS — I10 Essential (primary) hypertension: Secondary | ICD-10-CM | POA: Diagnosis not present

## 2020-02-25 DIAGNOSIS — R351 Nocturia: Secondary | ICD-10-CM | POA: Diagnosis not present

## 2020-02-28 DIAGNOSIS — R7309 Other abnormal glucose: Secondary | ICD-10-CM | POA: Diagnosis not present

## 2020-02-28 DIAGNOSIS — E782 Mixed hyperlipidemia: Secondary | ICD-10-CM | POA: Diagnosis not present

## 2020-02-28 DIAGNOSIS — I10 Essential (primary) hypertension: Secondary | ICD-10-CM | POA: Diagnosis not present

## 2020-02-28 DIAGNOSIS — Z79899 Other long term (current) drug therapy: Secondary | ICD-10-CM | POA: Diagnosis not present

## 2020-03-06 DIAGNOSIS — R0600 Dyspnea, unspecified: Secondary | ICD-10-CM | POA: Diagnosis not present

## 2020-03-06 DIAGNOSIS — G4733 Obstructive sleep apnea (adult) (pediatric): Secondary | ICD-10-CM | POA: Diagnosis not present

## 2020-03-24 DIAGNOSIS — E531 Pyridoxine deficiency: Secondary | ICD-10-CM | POA: Diagnosis not present

## 2020-03-24 DIAGNOSIS — E538 Deficiency of other specified B group vitamins: Secondary | ICD-10-CM | POA: Diagnosis not present

## 2020-03-24 DIAGNOSIS — E519 Thiamine deficiency, unspecified: Secondary | ICD-10-CM | POA: Diagnosis not present

## 2020-03-24 DIAGNOSIS — H5010 Unspecified exotropia: Secondary | ICD-10-CM | POA: Diagnosis not present

## 2020-03-24 DIAGNOSIS — M79602 Pain in left arm: Secondary | ICD-10-CM | POA: Diagnosis not present

## 2020-03-24 DIAGNOSIS — M79609 Pain in unspecified limb: Secondary | ICD-10-CM | POA: Diagnosis not present

## 2020-03-24 DIAGNOSIS — E559 Vitamin D deficiency, unspecified: Secondary | ICD-10-CM | POA: Diagnosis not present

## 2020-03-24 DIAGNOSIS — R202 Paresthesia of skin: Secondary | ICD-10-CM | POA: Diagnosis not present

## 2020-05-13 NOTE — Progress Notes (Signed)
05/15/2020 3:20 PM   Samuel Simpson September 15, 1930 [id number]  Referring provider: Idelle Crouch, MD [facility] [place],  [place] [address] Chief Complaint  Patient presents with  . Follow-up    Urologic history: 1.  BPH with lower urinary tract symptoms  HPI: Samuel Simpson is a 86 y.o. male who returns today for a 6 month follow up for BPH with lower urinary tract symptoms.   -He has no bothersome lower urinary tract symptoms  -He does have a history of symptomatic inflammatory prostatitis and thinks he was put on the prostate medications for that reason although he remains asymptomatic. -At visit 6 months ago he had been off tamsulosin and requested to stop finasteride -Presently on no prostate medications and is asymptomatic -Patient is interested in a Urolift if voiding symptoms recur and persist.  -Occasionally using Cialis and Viagra for ED. -States not as effective as previously however the medication is over 27 years old    PMH: Past Medical History:  Diagnosis Date  . Arrhythmia   . Atrial fibrillation ([place])   . BPH (benign prostatic hyperplasia)   . Heart murmur     Surgical History: Past Surgical History:  Procedure Laterality Date  . HERNIA REPAIR      Home Medications:  Allergies as of 05/15/2020   No Known Allergies     Medication List       Accurate as of May 15, 2020  3:20 PM. If you have any questions, ask your nurse or doctor.        apixaban 5 MG Tabs tablet Commonly known as: ELIQUIS Take 5 mg by mouth daily.   finasteride 5 MG tablet Commonly known as: PROSCAR Take 5 mg by mouth daily.   hydrochlorothiazide 12.5 MG tablet Commonly known as: HYDRODIURIL Take 12.5 mg by mouth daily.   losartan 50 MG tablet Commonly known as: COZAAR Take 50 mg by mouth daily.   losartan-hydrochlorothiazide 50-12.5 MG tablet Commonly known as: HYZAAR Take 1 tablet by mouth daily.   metoprolol tartrate 25 MG  tablet Commonly known as: LOPRESSOR Take 25 mg by mouth at bedtime.   potassium chloride 20 MEQ packet Commonly known as: KLOR-CON Take 20 mEq by mouth daily. Per patient occasionally   tamsulosin 0.4 MG Caps capsule Commonly known as: FLOMAX Take 1 capsule (0.4 mg total) by mouth daily after breakfast.   Vitamin D (Ergocalciferol) 1.25 MG (50000 UNIT) Caps capsule Commonly known as: DRISDOL Take 50,000 Units by mouth once a week.       Allergies: No Known Allergies  Family History: Family History  Problem Relation Age of Onset  . Heart attack Father     Social History:  reports that he has never smoked. He has never used smokeless tobacco. He reports current alcohol use of about 7.0 standard drinks of alcohol per week. He reports that he does not use drugs.   Physical Exam: BP (!) 129/94 (BP Location: Left Arm, Patient Position: Sitting, Cuff Size: Normal)   Pulse 65   Ht 6\' 1"  (1.854 m)   Wt 219 lb 12.8 oz (99.7 kg)   BMI 29.00 kg/m   Constitutional:  Alert and oriented, No acute distress. HEENT: [place] AT, moist mucus membranes.  Trachea midline, no masses. Cardiovascular: No clubbing, cyanosis, or edema. Respiratory: Normal respiratory effort, no increased work of breathing. Skin: No rashes, bruises or suspicious lesions. Neurologic: Grossly intact, no focal deficits, moving all 4 extremities. Psychiatric: Normal  mood and affect.    Assessment & Plan:    1. BPH with lower urinary tract symptoms -He has no bothersome lower urinary tract symptoms.  -Patient elected another follow up in 6 months rather than following up as needed. -Currently off tamsulosin and finasteride.  If voiding symptoms recur he is considering Urolift. I provided information regarding Urolift. Patient understood.   2. Erectile dysfunction -Using both Cialis and Viagra.  -Rx for generic Viagra sent.  Follow up in 6 months   [place] [address]  [place], [place] [address] [phone]  I, Selena Batten, am acting as a scribe for Dr. Nicki Reaper C. Alayziah Tangeman,  I have reviewed the above documentation for accuracy and completeness, and I agree with the above.   Abbie Sons, MD

## 2020-05-15 ENCOUNTER — Encounter: Payer: Self-pay | Admitting: Urology

## 2020-05-15 ENCOUNTER — Other Ambulatory Visit: Payer: Self-pay

## 2020-05-15 ENCOUNTER — Ambulatory Visit: Payer: PPO | Admitting: Urology

## 2020-05-15 VITALS — BP 129/94 | HR 65 | Ht 73.0 in | Wt 219.8 lb

## 2020-05-15 DIAGNOSIS — N5201 Erectile dysfunction due to arterial insufficiency: Secondary | ICD-10-CM

## 2020-05-15 DIAGNOSIS — N400 Enlarged prostate without lower urinary tract symptoms: Secondary | ICD-10-CM

## 2020-05-15 MED ORDER — SILDENAFIL CITRATE 20 MG PO TABS
ORAL_TABLET | ORAL | 0 refills | Status: DC
Start: 2020-05-15 — End: 2021-11-21

## 2020-06-13 ENCOUNTER — Emergency Department: Payer: PPO

## 2020-06-13 ENCOUNTER — Emergency Department
Admission: EM | Admit: 2020-06-13 | Discharge: 2020-06-13 | Disposition: A | Discharge status: Home / Self Care | Payer: PPO | Attending: Emergency Medicine | Admitting: Emergency Medicine

## 2020-06-13 ENCOUNTER — Encounter: Payer: Self-pay | Admitting: Intensive Care

## 2020-06-13 ENCOUNTER — Other Ambulatory Visit: Payer: Self-pay

## 2020-06-13 DIAGNOSIS — Y999 Unspecified external cause status: Secondary | ICD-10-CM | POA: Diagnosis not present

## 2020-06-13 DIAGNOSIS — Z79899 Other long term (current) drug therapy: Secondary | ICD-10-CM | POA: Diagnosis not present

## 2020-06-13 DIAGNOSIS — S0121XA Laceration without foreign body of nose, initial encounter: Secondary | ICD-10-CM | POA: Diagnosis not present

## 2020-06-13 DIAGNOSIS — T07XXXA Unspecified multiple injuries, initial encounter: Secondary | ICD-10-CM

## 2020-06-13 DIAGNOSIS — S022XXB Fracture of nasal bones, initial encounter for open fracture: Secondary | ICD-10-CM | POA: Diagnosis not present

## 2020-06-13 DIAGNOSIS — Y92007 Garden or yard of unspecified non-institutional (private) residence as the place of occurrence of the external cause: Secondary | ICD-10-CM | POA: Insufficient documentation

## 2020-06-13 DIAGNOSIS — Y9389 Activity, other specified: Secondary | ICD-10-CM | POA: Diagnosis not present

## 2020-06-13 DIAGNOSIS — S8002XA Contusion of left knee, initial encounter: Secondary | ICD-10-CM | POA: Insufficient documentation

## 2020-06-13 DIAGNOSIS — S022XXA Fracture of nasal bones, initial encounter for closed fracture: Secondary | ICD-10-CM | POA: Diagnosis not present

## 2020-06-13 DIAGNOSIS — W010XXA Fall on same level from slipping, tripping and stumbling without subsequent striking against object, initial encounter: Secondary | ICD-10-CM | POA: Insufficient documentation

## 2020-06-13 DIAGNOSIS — Y92009 Unspecified place in unspecified non-institutional (private) residence as the place of occurrence of the external cause: Secondary | ICD-10-CM

## 2020-06-13 DIAGNOSIS — S0990XA Unspecified injury of head, initial encounter: Secondary | ICD-10-CM | POA: Diagnosis not present

## 2020-06-13 DIAGNOSIS — S81012A Laceration without foreign body, left knee, initial encounter: Secondary | ICD-10-CM | POA: Diagnosis not present

## 2020-06-13 DIAGNOSIS — S80212A Abrasion, left knee, initial encounter: Secondary | ICD-10-CM | POA: Insufficient documentation

## 2020-06-13 DIAGNOSIS — S199XXA Unspecified injury of neck, initial encounter: Secondary | ICD-10-CM | POA: Diagnosis not present

## 2020-06-13 DIAGNOSIS — I10 Essential (primary) hypertension: Secondary | ICD-10-CM | POA: Insufficient documentation

## 2020-06-13 HISTORY — DX: Sleep apnea, unspecified: G47.30

## 2020-06-13 HISTORY — DX: Essential (primary) hypertension: I10

## 2020-06-13 MED ORDER — LIDOCAINE HCL (PF) 1 % IJ SOLN
5.0000 mL | Freq: Once | INTRAMUSCULAR | Status: AC
  Administered 2020-06-13: 5 mL
  Filled 2020-06-13: qty 5

## 2020-06-13 MED ORDER — LIDOCAINE-EPINEPHRINE-TETRACAINE (LET) TOPICAL GEL
3.0000 mL | Freq: Once | TOPICAL | Status: AC
  Administered 2020-06-13: 3 mL via TOPICAL
  Filled 2020-06-13: qty 3

## 2020-06-13 MED ORDER — CEPHALEXIN 500 MG PO CAPS: 500.0000 mg | ORAL_CAPSULE | Freq: Three times a day (TID) | ORAL | 0 refills | Status: DC

## 2020-06-13 MED ORDER — CEFAZOLIN SODIUM-DEXTROSE 1-4 GM/50ML-% IV SOLN: 1.0000 g | Freq: Once | INTRAVENOUS | Status: DC

## 2020-06-13 MED ORDER — CEFAZOLIN SODIUM 1 G IJ SOLR
1.0000 g | Freq: Once | INTRAMUSCULAR | Status: AC
  Administered 2020-06-13: 1 g via INTRAMUSCULAR
  Filled 2020-06-13: qty 10

## 2020-06-13 NOTE — ED Notes (Signed)
See triage note  Presents s/p trip and fall  States he tripped over a garden hose  Landed face first  Abrasion noted to left knee and abrasion to nose and forehead

## 2020-06-13 NOTE — Discharge Instructions (Addendum)
Keep your appointment with your specialist tomorrow for your nerve conduction study.  Keep the areas clean and dry and watch for any signs of infection.  The abrasions that you have on your knee should be watched for any signs of infection.  A prescription for antibiotics was sent to your pharmacy.  You may take Tylenol if needed for pain.   Sutures to be removed in 7 days.  You may go to your primary care provider, [facility] acute care or urgent care to have your sutures removed. The contact information for Dr. Tami Ribas is listed on your discharge papers.  You should call and make an appointment for him to follow-up with your nasal fracture.  If any signs of infection or urgent concerns return to the emergency department.  Ice and elevate your knee to reduce swelling and help with pain.  If you continue to have problems Dr. Rudene Christians is on-call for orthopedics and his office is located in the [facility].

## 2020-06-13 NOTE — ED Provider Notes (Signed)
[facility] Emergency Department Provider Note  ____________________________________________   First MD Initiated Contact with Patient 06/13/20 1523     (approximate)  I have reviewed the triage vital signs and the nursing notes.   HISTORY  Chief Complaint Fall   HPI Samuel Simpson is a 86 y.o. male presents to the ED with complaint of laceration to his nose and facial abrasions secondary to fall that occurred when he tripped over a garden hose.  Patient also reports that he has abrasions to his left knee but is able to stand and bear weight without any difficulty.  Patient currently is taking Eliquis for his history of A. fib and is aware that this most likely is the cause of his continued bleeding on his face.  He denies any loss of consciousness, dizziness, visual changes, nausea or vomiting.      Past Medical History:  Diagnosis Date  . Arrhythmia   . Atrial fibrillation ([place])   . BPH (benign prostatic hyperplasia)   . Heart murmur   . Hypertension   . Sleep apnea     Patient Active Problem List   Diagnosis Date Noted  . History of prostatitis 11/10/2018  . History of sinusitis 11/10/2018  . Hyperlipidemia, unspecified 11/10/2018  . Hypertension 11/10/2018  . PAF (paroxysmal atrial fibrillation) ([place]) 11/10/2018  . Sleep apnea 11/10/2018  . Hernia of abdominal cavity 08/25/2014  . Renal cysts, acquired, bilateral 08/25/2014  . Abdominal pain, suprapubic 08/18/2014  . Microscopic hematuria 08/18/2014  . Acute cystitis with hematuria 07/13/2014  . Benign localized hyperplasia of prostate with urinary obstruction 05/06/2013  . Chronic prostatitis 05/06/2013  . ED (erectile dysfunction) of organic origin 05/06/2013  . Elevated prostate specific antigen (PSA) 05/06/2013  . Incomplete emptying of bladder 05/06/2013    Past Surgical History:  Procedure Laterality Date  . HERNIA REPAIR      Prior to Admission medications   Medication Sig  Start Date End Date Taking? Authorizing Provider  apixaban (ELIQUIS) 5 MG TABS tablet Take 5 mg by mouth daily.  03/18/18   [provider]  cephALEXin (KEFLEX) 500 MG capsule Take 1 capsule (500 mg total) by mouth 3 (three) times daily. 06/13/20   Johnn Hai, PA-C  hydrochlorothiazide (HYDRODIURIL) 12.5 MG tablet Take 12.5 mg by mouth daily. 11/01/19   [provider]  losartan (COZAAR) 50 MG tablet Take 50 mg by mouth daily. 02/28/20   [provider]  losartan-hydrochlorothiazide (HYZAAR) 50-12.5 MG tablet Take 1 tablet by mouth daily.     [provider]  metoprolol tartrate (LOPRESSOR) 25 MG tablet Take 25 mg by mouth at bedtime.  04/20/13   [provider]  potassium chloride (KLOR-CON) 20 MEQ packet Take 20 mEq by mouth daily. Per patient occasionally    [provider]  sildenafil (REVATIO) 20 MG tablet 2-5 tabs 1 hour prior to intercourse 05/15/20   Stoioff, Ronda Fairly, MD  Vitamin D, Ergocalciferol, (DRISDOL) 1.25 MG (50000 UNIT) CAPS capsule Take 50,000 Units by mouth once a week. 04/04/20   [provider]    Allergies Patient has no known allergies.  Family History  Problem Relation Age of Onset  . Heart attack Father     Social History Social History   Tobacco Use  . Smoking status: Never Smoker  . Smokeless tobacco: Never Used  Vaping Use  . Vaping Use: Never used  Substance Use Topics  . Alcohol use: Yes    Alcohol/week: 7.0  standard drinks    Types: 7 Glasses of wine per week  . Drug use: Never    Review of Systems Constitutional: No fever/chills Eyes: No visual changes. ENT: No sore throat.  Laceration to nose.  Abrasion chin. Cardiovascular: Denies chest pain. Respiratory: Denies shortness of breath. Gastrointestinal: No abdominal pain.  No nausea, no vomiting. Musculoskeletal: Negative for back pain or pain to upper and lower extremities. Skin: Multiple abrasions left leg and laceration to  nose. Neurological: Negative for headaches, focal weakness or numbness. ____________________________________________   PHYSICAL EXAM:  VITAL SIGNS: ED Triage Vitals  Enc Vitals Group     BP 06/13/20 1454 117/87     Pulse Rate 06/13/20 1454 95     Resp 06/13/20 1454 16     Temp 06/13/20 1454 98.1 F (36.7 C)     Temp Source 06/13/20 1454 Oral     SpO2 06/13/20 1454 94 %     Weight 06/13/20 1455 225 lb (102.1 kg)     Height 06/13/20 1455 6\' 1"  (1.854 m)     Head Circumference --      Peak Flow --      Pain Score 06/13/20 1455 0     Pain Loc --      Pain Edu? --      Excl. in [place]? --    Constitutional: Alert and oriented. Well appearing and in no acute distress.  Patient is able to talk in complete sentences without any difficulty in answering questions appropriately.  He reports that he drove himself to the ED. Eyes: Conjunctivae are normal. PERRL. EOMI. no nystagmus. Head: Atraumatic. Nose: No congestion/rhinnorhea.  No gross deformities noted of the nasal area however patient does have a laceration with active bleeding on the distal tip of his nose.  Moderate edema is noted in the nasal area.  No foreign body present. Mouth/Throat: Mucous membranes are moist.  No dental injury is noted.  There is some soft tissue edema noted of the lower lip with bruising but no laceration present. Neck: No stridor.  No cervical tenderness on palpation posteriorly. Cardiovascular: Normal rate, regular rhythm. Grossly normal heart sounds.  Good peripheral circulation. Respiratory: Normal respiratory effort.  No retractions. Lungs CTAB.  No tenderness is appreciated on compression of the ribs and no direct trauma including soft tissue edema or abrasions were noted. Gastrointestinal: Soft and nontender. No distention.  Bowel sounds normoactive x4 quadrants. Musculoskeletal: Moves upper and lower extremities they have difficulty.  Patient is able to move left knee flexion and extension without any  restriction.  There is a superficial abrasion noted to the anterior portion of the left patella without active bleeding or foreign body noted.  After nose was sutured it was noted that the left knee had marked amount of soft tissue swelling which most likely is due to the Eliquis and a hematoma.  X-rays were then ordered. Neurologic:  Normal speech and language. No gross focal neurologic deficits are appreciated.  Skin:  Skin is warm, dry.  Multiple superficial abrasions are noted to the left patella with no active bleeding, chin area superficial abrasions.  Laceration to the nose as noted above. Psychiatric: Mood and affect are normal. Speech and behavior are normal.  ____________________________________________   LABS (all labs ordered are listed, but only abnormal results are displayed)  Labs Reviewed - No data to display  RADIOLOGY   Official radiology report(s): CT Head Wo Contrast  Result Date: 06/13/2020 CLINICAL DATA:  Patient tripped and fell  face first onto the ground. Laceration to the nose. Patient takes Eliquis. EXAM: CT HEAD WITHOUT CONTRAST CT MAXILLOFACIAL WITHOUT CONTRAST CT CERVICAL SPINE WITHOUT CONTRAST TECHNIQUE: Multidetector CT imaging of the head, cervical spine, and maxillofacial structures were performed using the standard protocol without intravenous contrast. Multiplanar CT image reconstructions of the cervical spine and maxillofacial structures were also generated. COMPARISON:  None. FINDINGS: CT HEAD FINDINGS Brain: No evidence of acute infarction, hemorrhage, hydrocephalus, extra-axial collection or mass lesion/mass effect. Vascular: No hyperdense vessel or unexpected calcification. Skull: Normal. Negative for fracture or focal lesion. Other: None. CT MAXILLOFACIAL FINDINGS Osseous: Mildly comminuted fracture along the inferior aspect of the nasal bone, without significant depression or displacement. There is associated soft tissue swelling. No other fractures.  No  bone lesions. Orbits: Negative. No traumatic or inflammatory finding. Sinuses: Minor mucosal thickening along the posterior, inferior left maxillary sinus. Remaining sinuses are clear. Clear mastoid air cells and middle ear cavities. Soft tissues: There is soft tissue swelling extending from the right mid nose to the nasal tip. No other soft tissue abnormality. CT CERVICAL SPINE FINDINGS Alignment: Normal. Skull base and vertebrae: No acute fracture. No primary bone lesion or focal pathologic process. Soft tissues and spinal canal: No prevertebral fluid or swelling. No visible canal hematoma. Disc levels: Mild loss of disc height at C5-C6 with moderate loss of disc height at C6-C7. Mild disc bulging and endplate spurring at these levels. No convincing disc herniation. Upper chest: No acute findings.  Clear lung apices. Other: None. IMPRESSION: HEAD CT 1. No acute intracranial abnormalities.  No skull fracture. MAXILLOFACIAL CT 1. Mildly comminuted fracture of the anterior inferior nasal bone without significant displacement or depression. There is overlying soft tissue swelling. 2. No other fractures.  No other acute abnormality. CERVICAL CT 1. No fracture or acute finding. Electronically Signed   By: Lajean Manes M.D.   On: 06/13/2020 16:41   CT Cervical Spine Wo Contrast  Result Date: 06/13/2020 CLINICAL DATA:  Patient tripped and fell face first onto the ground. Laceration to the nose. Patient takes Eliquis. EXAM: CT HEAD WITHOUT CONTRAST CT MAXILLOFACIAL WITHOUT CONTRAST CT CERVICAL SPINE WITHOUT CONTRAST TECHNIQUE: Multidetector CT imaging of the head, cervical spine, and maxillofacial structures were performed using the standard protocol without intravenous contrast. Multiplanar CT image reconstructions of the cervical spine and maxillofacial structures were also generated. COMPARISON:  None. FINDINGS: CT HEAD FINDINGS Brain: No evidence of acute infarction, hemorrhage, hydrocephalus, extra-axial  collection or mass lesion/mass effect. Vascular: No hyperdense vessel or unexpected calcification. Skull: Normal. Negative for fracture or focal lesion. Other: None. CT MAXILLOFACIAL FINDINGS Osseous: Mildly comminuted fracture along the inferior aspect of the nasal bone, without significant depression or displacement. There is associated soft tissue swelling. No other fractures.  No bone lesions. Orbits: Negative. No traumatic or inflammatory finding. Sinuses: Minor mucosal thickening along the posterior, inferior left maxillary sinus. Remaining sinuses are clear. Clear mastoid air cells and middle ear cavities. Soft tissues: There is soft tissue swelling extending from the right mid nose to the nasal tip. No other soft tissue abnormality. CT CERVICAL SPINE FINDINGS Alignment: Normal. Skull base and vertebrae: No acute fracture. No primary bone lesion or focal pathologic process. Soft tissues and spinal canal: No prevertebral fluid or swelling. No visible canal hematoma. Disc levels: Mild loss of disc height at C5-C6 with moderate loss of disc height at C6-C7. Mild disc bulging and endplate spurring at these levels. No convincing disc herniation. Upper chest: No acute findings.  Clear lung apices. Other: None. IMPRESSION: HEAD CT 1. No acute intracranial abnormalities.  No skull fracture. MAXILLOFACIAL CT 1. Mildly comminuted fracture of the anterior inferior nasal bone without significant displacement or depression. There is overlying soft tissue swelling. 2. No other fractures.  No other acute abnormality. CERVICAL CT 1. No fracture or acute finding. Electronically Signed   By: Lajean Manes M.D.   On: 06/13/2020 16:41   DG Knee Complete 4 Views Left  Result Date: 06/13/2020 CLINICAL DATA:  Patient complains of left knee pain after tripping over garden hose today. Laceration to anterior left knee. Swelling to entire left knee. EXAM: LEFT KNEE - COMPLETE 4+ VIEW COMPARISON:  None. FINDINGS: No fracture or bone  lesion. Knee joint normally spaced and aligned.  No arthropathic changes. Trace joint effusion. There is significant anterior soft tissue swelling. IMPRESSION: 1. No fracture. Minimal joint effusion. No other joint abnormality. 2. Anterior soft tissue swelling Electronically Signed   By: Lajean Manes M.D.   On: 06/13/2020 17:38   CT Maxillofacial Wo Contrast  Result Date: 06/13/2020 CLINICAL DATA:  Patient tripped and fell face first onto the ground. Laceration to the nose. Patient takes Eliquis. EXAM: CT HEAD WITHOUT CONTRAST CT MAXILLOFACIAL WITHOUT CONTRAST CT CERVICAL SPINE WITHOUT CONTRAST TECHNIQUE: Multidetector CT imaging of the head, cervical spine, and maxillofacial structures were performed using the standard protocol without intravenous contrast. Multiplanar CT image reconstructions of the cervical spine and maxillofacial structures were also generated. COMPARISON:  None. FINDINGS: CT HEAD FINDINGS Brain: No evidence of acute infarction, hemorrhage, hydrocephalus, extra-axial collection or mass lesion/mass effect. Vascular: No hyperdense vessel or unexpected calcification. Skull: Normal. Negative for fracture or focal lesion. Other: None. CT MAXILLOFACIAL FINDINGS Osseous: Mildly comminuted fracture along the inferior aspect of the nasal bone, without significant depression or displacement. There is associated soft tissue swelling. No other fractures.  No bone lesions. Orbits: Negative. No traumatic or inflammatory finding. Sinuses: Minor mucosal thickening along the posterior, inferior left maxillary sinus. Remaining sinuses are clear. Clear mastoid air cells and middle ear cavities. Soft tissues: There is soft tissue swelling extending from the right mid nose to the nasal tip. No other soft tissue abnormality. CT CERVICAL SPINE FINDINGS Alignment: Normal. Skull base and vertebrae: No acute fracture. No primary bone lesion or focal pathologic process. Soft tissues and spinal canal: No prevertebral  fluid or swelling. No visible canal hematoma. Disc levels: Mild loss of disc height at C5-C6 with moderate loss of disc height at C6-C7. Mild disc bulging and endplate spurring at these levels. No convincing disc herniation. Upper chest: No acute findings.  Clear lung apices. Other: None. IMPRESSION: HEAD CT 1. No acute intracranial abnormalities.  No skull fracture. MAXILLOFACIAL CT 1. Mildly comminuted fracture of the anterior inferior nasal bone without significant displacement or depression. There is overlying soft tissue swelling. 2. No other fractures.  No other acute abnormality. CERVICAL CT 1. No fracture or acute finding. Electronically Signed   By: Lajean Manes M.D.   On: 06/13/2020 16:41    ____________________________________________   PROCEDURES  Procedure(s) performed (including Critical Care):  Marland KitchenMarland KitchenLaceration Repair  Date/Time: 06/13/2020 4:17 PM Performed by: Johnn Hai, PA-C Authorized by: Johnn Hai, PA-C   Consent:    Consent obtained:  Verbal   Consent given by:  Patient   Risks discussed:  Infection, poor cosmetic result and poor wound healing Anesthesia (see MAR for exact dosages):    Anesthesia method:  Topical application and local infiltration  Topical anesthetic:  LET   Local anesthetic:  Lidocaine 1% w/o epi Laceration details:    Location:  Face   Face location:  Nose   Length (cm):  1.5 Repair type:    Repair type:  Simple Pre-procedure details:    Preparation:  Patient was prepped and draped in usual sterile fashion and imaging obtained to evaluate for foreign bodies Exploration:    Hemostasis achieved with:  LET and direct pressure   Contaminated: no   Treatment:    Area cleansed with:  Saline   Amount of cleaning:  Standard   Irrigation solution:  Sterile saline   Irrigation volume:  60 ml   Irrigation method:  Syringe and tap   Visualized foreign bodies/material removed: no   Skin repair:    Repair method:  Sutures   Suture  size:  6-0   Suture material:  Nylon   Suture technique:  Simple interrupted   Number of sutures:  5 Approximation:    Approximation:  Close Post-procedure details:    Patient tolerance of procedure:  Tolerated well, no immediate complications Comments:     Also 2 simple interrupted sutures with 6-0 prolene was used to control bleeding.     ____________________________________________   INITIAL IMPRESSION / ASSESSMENT AND PLAN / ED COURSE  As part of my medical decision making, I reviewed the following data within the electronic MEDICAL RECORD NUMBER Notes from prior ED visits and [place] Controlled Substance Database  Samuel Simpson was evaluated in Emergency Department on 06/13/2020 for the symptoms described in the history of present illness. He was evaluated in the context of the global COVID-19 pandemic, which necessitated consideration that the patient might be at risk for infection with the SARS-CoV-2 virus that causes COVID-19. Institutional protocols and algorithms that pertain to the evaluation of patients at risk for COVID-19 are in a state of rapid change based on information released by regulatory bodies including the CDC and federal and state organizations. These policies and algorithms were followed during the patient's care in the ED.  86 year old male presents to the ED after he fell tripping over a water hose at his home.  Patient denies any loss of consciousness and states that he began having a nosebleed that he tried to control at home but was unable to.  Patient also has multiple abrasions with one being to his left knee.  Patient was able to drive himself to the emergency department has been ambulatory without any assistance.  CT of the head and neck were negative and patient was made aware that there was no acute bony injury.  His nasal bone however does have a fracture and patient will follow up with Dr. Tami Ribas.  He is aware that there will be more swelling tomorrow than today and  look worse tomorrow.  Sutures were placed and bleeding was under control.  Abrasions were cleaned.  Patient is aware to ice and elevate his knee to reduce swelling and help with pain.  He reports that he does not need any pain medication.  We will follow up with Dr. Rudene Christians if any continued problems with his knee.  Ice pack and Jones wrap was applied to the left knee.  Patient is encouraged to ice and elevate his knee to reduce swelling and help with pain.  Ancef 1 gm given in ED while here and a prescription for Keflex was sent to his pharmacy.  Sutures to be removed in 5 to 7 days.  ____________________________________________  FINAL CLINICAL IMPRESSION(S) / ED DIAGNOSES  Final diagnoses:  Open fracture of nasal bone, initial encounter  Laceration of nose, initial encounter  Contusion of left knee, initial encounter  Abrasions of multiple sites  Fall in home, initial encounter     ED Discharge Orders         Ordered    cephALEXin (KEFLEX) 500 MG capsule  3 times daily     Discontinue  Reprint     06/13/20 1752           Note:  This document was prepared using Dragon voice recognition software and may include unintentional dictation errors.    Johnn Hai, PA-C 06/13/20 1841    Vladimir Crofts, MD 06/13/20 [phone]

## 2020-06-13 NOTE — ED Triage Notes (Addendum)
Patient reports tripping over his hose outside and falling face first onto ground. Skin tears present on chin and left knee. Patient has laceration noted to end up nose. Reports he could not get it to stop bleeding which is why he came to ER. Bandage placed on patients nose controlling bleeding. Patient takes eliquis daily. Denies LOC. Drove self to ER

## 2020-06-19 DIAGNOSIS — H6122 Impacted cerumen, left ear: Secondary | ICD-10-CM | POA: Diagnosis not present

## 2020-06-19 DIAGNOSIS — S022XXA Fracture of nasal bones, initial encounter for closed fracture: Secondary | ICD-10-CM | POA: Diagnosis not present

## 2020-06-19 DIAGNOSIS — S0121XA Laceration without foreign body of nose, initial encounter: Secondary | ICD-10-CM | POA: Diagnosis not present

## 2020-06-22 DIAGNOSIS — R202 Paresthesia of skin: Secondary | ICD-10-CM | POA: Diagnosis not present

## 2020-06-22 DIAGNOSIS — M79602 Pain in left arm: Secondary | ICD-10-CM | POA: Diagnosis not present

## 2020-07-13 DIAGNOSIS — H903 Sensorineural hearing loss, bilateral: Secondary | ICD-10-CM | POA: Diagnosis not present

## 2020-07-26 DIAGNOSIS — E538 Deficiency of other specified B group vitamins: Secondary | ICD-10-CM | POA: Diagnosis not present

## 2020-07-26 DIAGNOSIS — R202 Paresthesia of skin: Secondary | ICD-10-CM | POA: Diagnosis not present

## 2020-07-26 DIAGNOSIS — H5010 Unspecified exotropia: Secondary | ICD-10-CM | POA: Diagnosis not present

## 2020-07-26 DIAGNOSIS — M79609 Pain in unspecified limb: Secondary | ICD-10-CM | POA: Diagnosis not present

## 2020-07-26 DIAGNOSIS — E559 Vitamin D deficiency, unspecified: Secondary | ICD-10-CM | POA: Diagnosis not present

## 2020-07-26 DIAGNOSIS — I480 Paroxysmal atrial fibrillation: Secondary | ICD-10-CM | POA: Diagnosis not present

## 2020-08-08 DIAGNOSIS — D225 Melanocytic nevi of trunk: Secondary | ICD-10-CM | POA: Diagnosis not present

## 2020-08-08 DIAGNOSIS — D485 Neoplasm of uncertain behavior of skin: Secondary | ICD-10-CM | POA: Diagnosis not present

## 2020-08-08 DIAGNOSIS — D2261 Melanocytic nevi of right upper limb, including shoulder: Secondary | ICD-10-CM | POA: Diagnosis not present

## 2020-08-08 DIAGNOSIS — L570 Actinic keratosis: Secondary | ICD-10-CM | POA: Diagnosis not present

## 2020-08-08 DIAGNOSIS — L820 Inflamed seborrheic keratosis: Secondary | ICD-10-CM | POA: Diagnosis not present

## 2020-08-08 DIAGNOSIS — D2271 Melanocytic nevi of right lower limb, including hip: Secondary | ICD-10-CM | POA: Diagnosis not present

## 2020-08-08 DIAGNOSIS — L538 Other specified erythematous conditions: Secondary | ICD-10-CM | POA: Diagnosis not present

## 2020-08-08 DIAGNOSIS — Z85828 Personal history of other malignant neoplasm of skin: Secondary | ICD-10-CM | POA: Diagnosis not present

## 2020-08-08 DIAGNOSIS — D2262 Melanocytic nevi of left upper limb, including shoulder: Secondary | ICD-10-CM | POA: Diagnosis not present

## 2020-09-04 DIAGNOSIS — G4733 Obstructive sleep apnea (adult) (pediatric): Secondary | ICD-10-CM | POA: Diagnosis not present

## 2020-09-04 DIAGNOSIS — J449 Chronic obstructive pulmonary disease, unspecified: Secondary | ICD-10-CM | POA: Diagnosis not present

## 2020-09-04 DIAGNOSIS — R42 Dizziness and giddiness: Secondary | ICD-10-CM | POA: Diagnosis not present

## 2020-09-04 DIAGNOSIS — J329 Chronic sinusitis, unspecified: Secondary | ICD-10-CM | POA: Diagnosis not present

## 2020-09-04 DIAGNOSIS — R002 Palpitations: Secondary | ICD-10-CM | POA: Diagnosis not present

## 2020-09-04 DIAGNOSIS — R0602 Shortness of breath: Secondary | ICD-10-CM | POA: Diagnosis not present

## 2020-09-04 DIAGNOSIS — R011 Cardiac murmur, unspecified: Secondary | ICD-10-CM | POA: Diagnosis not present

## 2020-09-04 DIAGNOSIS — I10 Essential (primary) hypertension: Secondary | ICD-10-CM | POA: Diagnosis not present

## 2020-09-04 DIAGNOSIS — I480 Paroxysmal atrial fibrillation: Secondary | ICD-10-CM | POA: Diagnosis not present

## 2020-09-04 DIAGNOSIS — E7849 Other hyperlipidemia: Secondary | ICD-10-CM | POA: Diagnosis not present

## 2020-11-16 ENCOUNTER — Other Ambulatory Visit: Payer: Self-pay

## 2020-11-16 ENCOUNTER — Ambulatory Visit (INDEPENDENT_AMBULATORY_CARE_PROVIDER_SITE_OTHER): Payer: PPO | Admitting: Urology

## 2020-11-16 ENCOUNTER — Encounter: Payer: Self-pay | Admitting: Urology

## 2020-11-16 VITALS — BP 132/93 | HR 109 | Ht 73.0 in | Wt 221.0 lb

## 2020-11-16 DIAGNOSIS — N138 Other obstructive and reflux uropathy: Secondary | ICD-10-CM

## 2020-11-16 DIAGNOSIS — N401 Enlarged prostate with lower urinary tract symptoms: Secondary | ICD-10-CM | POA: Diagnosis not present

## 2020-11-16 NOTE — Progress Notes (Signed)
11/16/2020 2:49 PM   Samuel Simpson [birth year] [id number]  Referring provider: Idelle Crouch, MD [facility] [place],  [place] [address]  Chief Complaint  Patient presents with  . Benign Prostatic Hypertrophy     Urologic history: 1.BPH with lower urinary tract symptoms  HPI: 87 y.o. male followed for BPH.   Previously on tamsulosin and finasteride and discontinued due to ED  At last office visit July 2021 he had been off tamsulosin and finasteride for 6 months  He was interested in UroLift if voiding symptoms recurred.  He states he is doing well and has no bothersome LUTS  Denies dysuria, gross hematuria  No flank, abdominal or pelvic pain   PMH: Past Medical History:  Diagnosis Date  . Arrhythmia   . Atrial fibrillation ([place])   . BPH (benign prostatic hyperplasia)   . Heart murmur   . Hypertension   . Sleep apnea     Surgical History: Past Surgical History:  Procedure Laterality Date  . HERNIA REPAIR      Home Medications:  Allergies as of 11/16/2020   No Known Allergies     Medication List       Accurate as of November 16, 2020  2:49 PM. If you have any questions, ask your nurse or doctor.        STOP taking these medications   cephALEXin 500 MG capsule Commonly known as: KEFLEX Stopped by: Abbie Sons, MD     TAKE these medications   apixaban 5 MG Tabs tablet Commonly known as: ELIQUIS Take 5 mg by mouth daily.   Cholecalciferol 25 MCG (1000 UT) tablet Take by mouth.   cyanocobalamin 1000 MCG tablet Take by mouth.   hydrochlorothiazide 12.5 MG tablet Commonly known as: HYDRODIURIL Take 12.5 mg by mouth daily.   losartan 50 MG tablet Commonly known as: COZAAR Take 50 mg by mouth daily.   losartan-hydrochlorothiazide 50-12.5 MG tablet Commonly known as: HYZAAR Take 1 tablet by mouth daily.   metoprolol tartrate 25 MG tablet Commonly known as: LOPRESSOR Take 25 mg by mouth at bedtime.    potassium chloride 20 MEQ packet Commonly known as: KLOR-CON Take 20 mEq by mouth daily. Per patient occasionally   sildenafil 20 MG tablet Commonly known as: REVATIO 2-5 tabs 1 hour prior to intercourse   Vitamin D (Ergocalciferol) 1.25 MG (50000 UNIT) Caps capsule Commonly known as: DRISDOL Take 50,000 Units by mouth once a week.       Allergies: No Known Allergies  Family History: Family History  Problem Relation Age of Onset  . Heart attack Father     Social History:  reports that he has never smoked. He has never used smokeless tobacco. He reports current alcohol use of about 7.0 standard drinks of alcohol per week. He reports that he does not use drugs.   Physical Exam: BP (!) 132/93   Pulse (!) 109   Ht 6\' 1"  (1.854 m)   Wt 221 lb (100.2 kg)   BMI 29.16 kg/m   Constitutional:  Alert and oriented, No acute distress. HEENT: [place] AT, moist mucus membranes.  Trachea midline, no masses. Cardiovascular: No clubbing, cyanosis, or edema. Respiratory: Normal respiratory effort, no increased work of breathing. Psychiatric: Normal mood and affect.   Assessment & Plan:    1. BPH with LUTS  No bothersome urinary symptoms off medication  We again briefly discussed UroLift  Follow-up 1 year and he was instructed to call  earlier for any significant change in his voiding symptoms   Abbie Sons, MD  [facility] [address], [place], [place] [address] [phone]

## 2020-11-17 ENCOUNTER — Encounter: Payer: Self-pay | Admitting: Urology

## 2020-11-30 ENCOUNTER — Telehealth: Payer: Self-pay | Admitting: *Deleted

## 2020-11-30 NOTE — Telephone Encounter (Signed)
Pt last seen 11/16/2020 and was given the option of Urolift procedure, pt thinks he is now ready. Pt states he was up 4 times last night and the night before, he has no energy, no appetite and his body is sore. Please advise when this can be done.  ** pt knows you are not in office this week

## 2020-12-05 NOTE — Telephone Encounter (Signed)
I would not recommend UroLift if the only symptom is getting up at night as it is unlikely this will improve since there are many other causes of this symptom.  Would recommend a PA follow-up to discuss treatment options for nocturia

## 2020-12-06 ENCOUNTER — Encounter: Payer: Self-pay | Admitting: *Deleted

## 2020-12-06 NOTE — Telephone Encounter (Signed)
Notified patient as instructed.   

## 2020-12-14 ENCOUNTER — Ambulatory Visit: Payer: PPO | Admitting: Physician Assistant

## 2020-12-14 ENCOUNTER — Other Ambulatory Visit: Payer: Self-pay

## 2020-12-14 ENCOUNTER — Encounter: Payer: Self-pay | Admitting: Physician Assistant

## 2020-12-14 VITALS — BP 132/89 | HR 90 | Ht 73.0 in | Wt 214.0 lb

## 2020-12-14 DIAGNOSIS — N138 Other obstructive and reflux uropathy: Secondary | ICD-10-CM | POA: Diagnosis not present

## 2020-12-14 DIAGNOSIS — R351 Nocturia: Secondary | ICD-10-CM | POA: Diagnosis not present

## 2020-12-14 DIAGNOSIS — N401 Enlarged prostate with lower urinary tract symptoms: Secondary | ICD-10-CM

## 2020-12-14 LAB — BLADDER SCAN AMB NON-IMAGING

## 2020-12-14 NOTE — Progress Notes (Signed)
12/14/2020 2:13 PM   Samuel Simpson [birth year] [id number]  CC: Chief Complaint  Patient presents with  . Nocturia   HPI: Samuel Simpson is a 87 y.o. male with erectile dysfunction on sildenafil and BPH who discontinued tamsulosin and finasteride approximately 13 months ago in the absence of voiding symptoms who presents today for evaluation of nocturia and to discuss UroLift.  Today he reports 2 consecutive evenings of nocturia x4 that subsequently resolved.  He now reports that he is sleeping through the night more than half the time and occasionally has nocturia x1.  He states he was very concerned when he developed increased nocturia and wondered if his LUTS were returning and so he scheduled a follow-up appointment in our clinic at that time.  Today he reports that he is received 2 doses of the Covid vaccine but has not received a booster.  He notes a diminished sense of taste that started about 2 weeks prior to his increased nocturia and he wonders if he may have been infected with coronavirus.  He wonders if this may have contributed to this episodic nocturia.  He has sleep apnea and is on diuretics.    PVR 0 mL.  IPSS 1/0 as below.   IPSS    Row Name 12/14/20 1300         International Prostate Symptom Score   How often have you had the sensation of not emptying your bladder? Not at All     How often have you had to urinate less than every two hours? Not at All     How often have you found you stopped and started again several times when you urinated? Not at All     How often have you found it difficult to postpone urination? Not at All     How often have you had a weak urinary stream? Not at All     How often have you had to strain to start urination? Not at All     How many times did you typically get up at night to urinate? 1 Time     Total IPSS Score 1           Quality of Life due to urinary symptoms   If you were to spend the rest of your life with your urinary  condition just the way it is now how would you feel about that? Delighted            PMH: Past Medical History:  Diagnosis Date  . Arrhythmia   . Atrial fibrillation ([place])   . BPH (benign prostatic hyperplasia)   . Heart murmur   . Hypertension   . Sleep apnea     Surgical History: Past Surgical History:  Procedure Laterality Date  . HERNIA REPAIR      Home Medications:  Allergies as of 12/14/2020   No Known Allergies     Medication List       Accurate as of December 14, 2020  2:13 PM. If you have any questions, ask your nurse or doctor.        apixaban 5 MG Tabs tablet Commonly known as: ELIQUIS Take 5 mg by mouth daily.   Cholecalciferol 25 MCG (1000 UT) tablet Take by mouth.   cyanocobalamin 1000 MCG tablet Take by mouth.   hydrochlorothiazide 12.5 MG tablet Commonly known as: HYDRODIURIL Take 12.5 mg by mouth daily.   losartan-hydrochlorothiazide 50-12.5 MG tablet Commonly known as: HYZAAR Take 1 tablet  by mouth daily.   metoprolol tartrate 25 MG tablet Commonly known as: LOPRESSOR Take 25 mg by mouth at bedtime.   potassium chloride 20 MEQ packet Commonly known as: KLOR-CON Take 20 mEq by mouth daily. Per patient occasionally   sildenafil 20 MG tablet Commonly known as: REVATIO 2-5 tabs 1 hour prior to intercourse       Allergies:  No Known Allergies  Family History: Family History  Problem Relation Age of Onset  . Heart attack Father     Social History:   reports that he has never smoked. He has never used smokeless tobacco. He reports current alcohol use of about 7.0 standard drinks of alcohol per week. He reports that he does not use drugs.  Physical Exam: BP 132/89   Pulse 90   Ht 6\' 1"  (1.854 m)   Wt 214 lb (97.1 kg)   BMI 28.23 kg/m   Constitutional:  Alert and oriented, no acute distress, nontoxic appearing HEENT: [place], AT Cardiovascular: No clubbing, cyanosis, or edema Respiratory: Normal respiratory effort, no  increased work of breathing Skin: No rashes, bruises or suspicious lesions Neurologic: Grossly intact, no focal deficits, moving all 4 extremities Psychiatric: Normal mood and affect  Laboratory Data: Results for orders placed or performed in visit on 12/14/20  Bladder Scan (Post Void Residual) in office  Result Value Ref Range   Scan Result 27mL    Assessment & Plan:   1. Nocturia Patient with ED and asymptomatic BPH at baseline presents after 2 consecutive evenings of nocturia x4 that resolved on its own without further intervention.  PVR WNL today and he is back at his asymptomatic baseline.  We discussed that nocturia can of multiple causes including sleep apnea, diuresis, nighttime fluid intake, and tissue edema, and I explained that I do not recommend prostate surgery for management of isolated nocturia.  We discussed that if and when his LUTS return, we may attempt lifestyle modifications and pharmacotherapy prior to proceeding to surgery.  Notably, may consider daily tadalafil for cotreatment of his ED and BPH if and when his symptoms recur.  No further treatment indicated today.  Patient to follow-up as needed with symptomatic worsening.  He expressed understanding. - Bladder Scan (Post Void Residual) in office   Return if symptoms worsen or fail to improve.  Debroah Loop, PA-C  [facility] [address] [place], [place] [address] [phone]

## 2021-03-05 DIAGNOSIS — G4733 Obstructive sleep apnea (adult) (pediatric): Secondary | ICD-10-CM | POA: Diagnosis not present

## 2021-03-05 DIAGNOSIS — J449 Chronic obstructive pulmonary disease, unspecified: Secondary | ICD-10-CM | POA: Diagnosis not present

## 2021-03-07 DIAGNOSIS — E559 Vitamin D deficiency, unspecified: Secondary | ICD-10-CM | POA: Diagnosis not present

## 2021-03-07 DIAGNOSIS — R351 Nocturia: Secondary | ICD-10-CM | POA: Diagnosis not present

## 2021-03-07 DIAGNOSIS — N401 Enlarged prostate with lower urinary tract symptoms: Secondary | ICD-10-CM | POA: Diagnosis not present

## 2021-03-07 DIAGNOSIS — E538 Deficiency of other specified B group vitamins: Secondary | ICD-10-CM | POA: Insufficient documentation

## 2021-03-07 DIAGNOSIS — Z0000 Encounter for general adult medical examination without abnormal findings: Secondary | ICD-10-CM | POA: Diagnosis not present

## 2021-03-07 DIAGNOSIS — E7800 Pure hypercholesterolemia, unspecified: Secondary | ICD-10-CM | POA: Diagnosis not present

## 2021-03-07 DIAGNOSIS — I10 Essential (primary) hypertension: Secondary | ICD-10-CM | POA: Diagnosis not present

## 2021-03-07 DIAGNOSIS — I480 Paroxysmal atrial fibrillation: Secondary | ICD-10-CM | POA: Diagnosis not present

## 2021-03-07 DIAGNOSIS — R7309 Other abnormal glucose: Secondary | ICD-10-CM | POA: Diagnosis not present

## 2021-03-07 DIAGNOSIS — Z79899 Other long term (current) drug therapy: Secondary | ICD-10-CM | POA: Diagnosis not present

## 2021-03-07 DIAGNOSIS — G4733 Obstructive sleep apnea (adult) (pediatric): Secondary | ICD-10-CM | POA: Diagnosis not present

## 2021-03-08 ENCOUNTER — Other Ambulatory Visit: Payer: Self-pay | Admitting: Internal Medicine

## 2021-03-08 DIAGNOSIS — I10 Essential (primary) hypertension: Secondary | ICD-10-CM | POA: Diagnosis not present

## 2021-03-08 DIAGNOSIS — I480 Paroxysmal atrial fibrillation: Secondary | ICD-10-CM | POA: Diagnosis not present

## 2021-03-08 DIAGNOSIS — E559 Vitamin D deficiency, unspecified: Secondary | ICD-10-CM | POA: Diagnosis not present

## 2021-03-08 DIAGNOSIS — E7800 Pure hypercholesterolemia, unspecified: Secondary | ICD-10-CM | POA: Diagnosis not present

## 2021-03-08 DIAGNOSIS — Z79899 Other long term (current) drug therapy: Secondary | ICD-10-CM | POA: Diagnosis not present

## 2021-03-08 DIAGNOSIS — E538 Deficiency of other specified B group vitamins: Secondary | ICD-10-CM | POA: Diagnosis not present

## 2021-03-08 DIAGNOSIS — R7309 Other abnormal glucose: Secondary | ICD-10-CM | POA: Diagnosis not present

## 2021-03-08 DIAGNOSIS — R17 Unspecified jaundice: Secondary | ICD-10-CM

## 2021-03-14 ENCOUNTER — Ambulatory Visit
Admission: RE | Admit: 2021-03-14 | Discharge: 2021-03-14 | Disposition: A | Discharge status: Home / Self Care | Payer: PPO | Source: Ambulatory Visit | Attending: Internal Medicine | Admitting: Internal Medicine

## 2021-03-14 ENCOUNTER — Other Ambulatory Visit: Payer: Self-pay

## 2021-03-14 DIAGNOSIS — R42 Dizziness and giddiness: Secondary | ICD-10-CM | POA: Diagnosis not present

## 2021-03-14 DIAGNOSIS — R17 Unspecified jaundice: Secondary | ICD-10-CM | POA: Insufficient documentation

## 2021-03-14 DIAGNOSIS — I10 Essential (primary) hypertension: Secondary | ICD-10-CM | POA: Diagnosis not present

## 2021-03-14 DIAGNOSIS — G4733 Obstructive sleep apnea (adult) (pediatric): Secondary | ICD-10-CM | POA: Diagnosis not present

## 2021-03-14 DIAGNOSIS — E7849 Other hyperlipidemia: Secondary | ICD-10-CM | POA: Diagnosis not present

## 2021-03-14 DIAGNOSIS — R002 Palpitations: Secondary | ICD-10-CM | POA: Diagnosis not present

## 2021-03-14 DIAGNOSIS — R0602 Shortness of breath: Secondary | ICD-10-CM | POA: Diagnosis not present

## 2021-03-14 DIAGNOSIS — J449 Chronic obstructive pulmonary disease, unspecified: Secondary | ICD-10-CM | POA: Diagnosis not present

## 2021-03-14 DIAGNOSIS — I480 Paroxysmal atrial fibrillation: Secondary | ICD-10-CM | POA: Diagnosis not present

## 2021-03-14 DIAGNOSIS — K760 Fatty (change of) liver, not elsewhere classified: Secondary | ICD-10-CM | POA: Diagnosis not present

## 2021-03-14 DIAGNOSIS — R011 Cardiac murmur, unspecified: Secondary | ICD-10-CM | POA: Diagnosis not present

## 2021-03-14 DIAGNOSIS — J329 Chronic sinusitis, unspecified: Secondary | ICD-10-CM | POA: Diagnosis not present

## 2021-08-06 DIAGNOSIS — L570 Actinic keratosis: Secondary | ICD-10-CM | POA: Diagnosis not present

## 2021-08-06 DIAGNOSIS — D2262 Melanocytic nevi of left upper limb, including shoulder: Secondary | ICD-10-CM | POA: Diagnosis not present

## 2021-08-06 DIAGNOSIS — D2271 Melanocytic nevi of right lower limb, including hip: Secondary | ICD-10-CM | POA: Diagnosis not present

## 2021-08-06 DIAGNOSIS — Z85828 Personal history of other malignant neoplasm of skin: Secondary | ICD-10-CM | POA: Diagnosis not present

## 2021-08-06 DIAGNOSIS — D2261 Melanocytic nevi of right upper limb, including shoulder: Secondary | ICD-10-CM | POA: Diagnosis not present

## 2021-08-06 DIAGNOSIS — X32XXXA Exposure to sunlight, initial encounter: Secondary | ICD-10-CM | POA: Diagnosis not present

## 2021-09-12 DIAGNOSIS — E559 Vitamin D deficiency, unspecified: Secondary | ICD-10-CM | POA: Diagnosis not present

## 2021-09-12 DIAGNOSIS — E538 Deficiency of other specified B group vitamins: Secondary | ICD-10-CM | POA: Diagnosis not present

## 2021-09-12 DIAGNOSIS — R7309 Other abnormal glucose: Secondary | ICD-10-CM | POA: Diagnosis not present

## 2021-09-12 DIAGNOSIS — G4733 Obstructive sleep apnea (adult) (pediatric): Secondary | ICD-10-CM | POA: Diagnosis not present

## 2021-09-12 DIAGNOSIS — R351 Nocturia: Secondary | ICD-10-CM | POA: Diagnosis not present

## 2021-09-12 DIAGNOSIS — E7800 Pure hypercholesterolemia, unspecified: Secondary | ICD-10-CM | POA: Diagnosis not present

## 2021-09-12 DIAGNOSIS — N401 Enlarged prostate with lower urinary tract symptoms: Secondary | ICD-10-CM | POA: Diagnosis not present

## 2021-09-12 DIAGNOSIS — Z79899 Other long term (current) drug therapy: Secondary | ICD-10-CM | POA: Diagnosis not present

## 2021-09-12 DIAGNOSIS — I10 Essential (primary) hypertension: Secondary | ICD-10-CM | POA: Diagnosis not present

## 2021-09-12 DIAGNOSIS — I480 Paroxysmal atrial fibrillation: Secondary | ICD-10-CM | POA: Diagnosis not present

## 2021-09-13 DIAGNOSIS — R0602 Shortness of breath: Secondary | ICD-10-CM | POA: Diagnosis not present

## 2021-09-13 DIAGNOSIS — J449 Chronic obstructive pulmonary disease, unspecified: Secondary | ICD-10-CM | POA: Diagnosis not present

## 2021-09-13 DIAGNOSIS — G4733 Obstructive sleep apnea (adult) (pediatric): Secondary | ICD-10-CM | POA: Diagnosis not present

## 2021-09-13 DIAGNOSIS — I10 Essential (primary) hypertension: Secondary | ICD-10-CM | POA: Diagnosis not present

## 2021-09-13 DIAGNOSIS — E7849 Other hyperlipidemia: Secondary | ICD-10-CM | POA: Diagnosis not present

## 2021-09-13 DIAGNOSIS — R002 Palpitations: Secondary | ICD-10-CM | POA: Diagnosis not present

## 2021-09-13 DIAGNOSIS — I480 Paroxysmal atrial fibrillation: Secondary | ICD-10-CM | POA: Diagnosis not present

## 2021-09-13 DIAGNOSIS — R011 Cardiac murmur, unspecified: Secondary | ICD-10-CM | POA: Diagnosis not present

## 2021-09-13 DIAGNOSIS — R42 Dizziness and giddiness: Secondary | ICD-10-CM | POA: Diagnosis not present

## 2021-09-13 DIAGNOSIS — J329 Chronic sinusitis, unspecified: Secondary | ICD-10-CM | POA: Diagnosis not present

## 2021-11-16 ENCOUNTER — Ambulatory Visit: Payer: Self-pay | Admitting: Urology

## 2021-11-21 ENCOUNTER — Other Ambulatory Visit: Payer: Self-pay

## 2021-11-21 ENCOUNTER — Encounter: Payer: Self-pay | Admitting: Urology

## 2021-11-21 ENCOUNTER — Ambulatory Visit: Payer: PPO | Admitting: Urology

## 2021-11-21 VITALS — BP 114/80 | HR 86 | Ht 73.0 in | Wt 220.0 lb

## 2021-11-21 DIAGNOSIS — N401 Enlarged prostate with lower urinary tract symptoms: Secondary | ICD-10-CM | POA: Diagnosis not present

## 2021-11-21 DIAGNOSIS — N5201 Erectile dysfunction due to arterial insufficiency: Secondary | ICD-10-CM | POA: Diagnosis not present

## 2021-11-21 DIAGNOSIS — N138 Other obstructive and reflux uropathy: Secondary | ICD-10-CM

## 2021-11-21 MED ORDER — SILDENAFIL CITRATE 20 MG PO TABS: ORAL_TABLET | ORAL | 2 refills | Status: DC

## 2021-11-21 NOTE — Progress Notes (Signed)
° °  11/21/2021 8:48 AM   Jeanell Sparrow Ronnald Ramp 1929/12/08 [id number]  Referring provider: Idelle Crouch, MD [facility] [place],  [place] [address]  Chief Complaint  Patient presents with   Benign Prostatic Hypertrophy    Urologic history: 1.  BPH with lower urinary tract symptoms  HPI: 88 y.o. male presents for annual follow-up.  He did have a PA visit 12/14/2020 complaining of nocturia.  He had had 2 episodes of nocturia x4 and then returned to baseline and at the time of his visit was not having bothersome symptoms. Currently with stable lower urinary tract symptoms which are not bothersome Take sildenafil on occasions and requesting refill  PMH: Past Medical History:  Diagnosis Date   Arrhythmia    Atrial fibrillation ([facility])    BPH (benign prostatic hyperplasia)    Heart murmur    Hypertension    Sleep apnea     Surgical History: Past Surgical History:  Procedure Laterality Date   HERNIA REPAIR      Home Medications:  Allergies as of 11/21/2021   No Known Allergies      Medication List        Accurate as of November 21, 2021  8:48 AM. If you have any questions, ask your nurse or doctor.          apixaban 5 MG Tabs tablet Commonly known as: ELIQUIS Take 5 mg by mouth daily.   Cholecalciferol 25 MCG (1000 UT) tablet Take by mouth.   cyanocobalamin 1000 MCG tablet Take by mouth.   hydrochlorothiazide 12.5 MG tablet Commonly known as: HYDRODIURIL Take 12.5 mg by mouth daily.   losartan-hydrochlorothiazide 50-12.5 MG tablet Commonly known as: HYZAAR Take 1 tablet by mouth daily.   metoprolol tartrate 25 MG tablet Commonly known as: LOPRESSOR Take 25 mg by mouth at bedtime.   potassium chloride 20 MEQ packet Commonly known as: KLOR-CON Take 20 mEq by mouth daily. Per patient occasionally   sildenafil 20 MG tablet Commonly known as: REVATIO 2-5 tabs 1 hour prior to intercourse        Allergies: No Known  Allergies  Family History: Family History  Problem Relation Age of Onset   Heart attack Father     Social History:  reports that he has never smoked. He has never used smokeless tobacco. He reports current alcohol use of about 7.0 standard drinks per week. He reports that he does not use drugs.   Physical Exam: BP 114/80    Pulse 86    Ht 6\' 1"  (1.854 m)    Wt 220 lb (99.8 kg)    BMI 29.03 kg/m   Constitutional:  Alert and oriented, No acute distress. HEENT: [place] AT, moist mucus membranes.  Trachea midline, no masses. Cardiovascular: No clubbing, cyanosis, or edema. Respiratory: Normal respiratory effort, no increased work of breathing.    Assessment & Plan:    1.  BPH with LUTS Stable voiding symptoms off medication We discussed the approval of daily tadalafil for BPH if symptoms worsen and he desired medical management Minimally invasive options including UroLift were discussed At present he does not desire to start any medication and is not interested in surgery Continue annual follow-up  2.  Erectile dysfunction Stable Vacuum erection devices and intracavernosal injections were discussed Sildenafil was refilled   Abbie Sons, MD  [facility] [address], [place], [place] [address] [phone]

## 2021-11-22 ENCOUNTER — Ambulatory Visit: Payer: PPO | Admitting: Urology

## 2022-01-23 DIAGNOSIS — D485 Neoplasm of uncertain behavior of skin: Secondary | ICD-10-CM | POA: Diagnosis not present

## 2022-01-23 DIAGNOSIS — D2339 Other benign neoplasm of skin of other parts of face: Secondary | ICD-10-CM | POA: Diagnosis not present

## 2022-02-06 DIAGNOSIS — M79644 Pain in right finger(s): Secondary | ICD-10-CM | POA: Diagnosis not present

## 2022-02-06 DIAGNOSIS — M65331 Trigger finger, right middle finger: Secondary | ICD-10-CM | POA: Diagnosis not present

## 2022-03-11 DIAGNOSIS — R002 Palpitations: Secondary | ICD-10-CM | POA: Diagnosis not present

## 2022-03-11 DIAGNOSIS — J449 Chronic obstructive pulmonary disease, unspecified: Secondary | ICD-10-CM | POA: Diagnosis not present

## 2022-03-11 DIAGNOSIS — I10 Essential (primary) hypertension: Secondary | ICD-10-CM | POA: Diagnosis not present

## 2022-03-11 DIAGNOSIS — E7849 Other hyperlipidemia: Secondary | ICD-10-CM | POA: Diagnosis not present

## 2022-03-11 DIAGNOSIS — Z79899 Other long term (current) drug therapy: Secondary | ICD-10-CM | POA: Diagnosis not present

## 2022-03-11 DIAGNOSIS — N401 Enlarged prostate with lower urinary tract symptoms: Secondary | ICD-10-CM | POA: Diagnosis not present

## 2022-03-11 DIAGNOSIS — E559 Vitamin D deficiency, unspecified: Secondary | ICD-10-CM | POA: Diagnosis not present

## 2022-03-11 DIAGNOSIS — R0602 Shortness of breath: Secondary | ICD-10-CM | POA: Diagnosis not present

## 2022-03-11 DIAGNOSIS — E538 Deficiency of other specified B group vitamins: Secondary | ICD-10-CM | POA: Diagnosis not present

## 2022-03-11 DIAGNOSIS — G4733 Obstructive sleep apnea (adult) (pediatric): Secondary | ICD-10-CM | POA: Diagnosis not present

## 2022-03-11 DIAGNOSIS — R7309 Other abnormal glucose: Secondary | ICD-10-CM | POA: Diagnosis not present

## 2022-03-11 DIAGNOSIS — Z0000 Encounter for general adult medical examination without abnormal findings: Secondary | ICD-10-CM | POA: Diagnosis not present

## 2022-03-11 DIAGNOSIS — R351 Nocturia: Secondary | ICD-10-CM | POA: Diagnosis not present

## 2022-03-11 DIAGNOSIS — I480 Paroxysmal atrial fibrillation: Secondary | ICD-10-CM | POA: Diagnosis not present

## 2022-03-11 DIAGNOSIS — E7800 Pure hypercholesterolemia, unspecified: Secondary | ICD-10-CM | POA: Diagnosis not present

## 2022-03-11 DIAGNOSIS — R011 Cardiac murmur, unspecified: Secondary | ICD-10-CM | POA: Diagnosis not present

## 2022-04-18 DIAGNOSIS — I499 Cardiac arrhythmia, unspecified: Secondary | ICD-10-CM | POA: Diagnosis not present

## 2022-04-18 DIAGNOSIS — R509 Fever, unspecified: Secondary | ICD-10-CM | POA: Diagnosis not present

## 2022-04-18 DIAGNOSIS — R350 Frequency of micturition: Secondary | ICD-10-CM | POA: Diagnosis not present

## 2022-04-18 DIAGNOSIS — N419 Inflammatory disease of prostate, unspecified: Secondary | ICD-10-CM | POA: Diagnosis not present

## 2022-04-21 ENCOUNTER — Other Ambulatory Visit: Payer: Self-pay

## 2022-04-21 ENCOUNTER — Emergency Department: Payer: PPO

## 2022-04-21 ENCOUNTER — Emergency Department
Admission: EM | Admit: 2022-04-21 | Discharge: 2022-04-21 | Disposition: A | Discharge status: Home / Self Care | Payer: PPO | Attending: Emergency Medicine | Admitting: Emergency Medicine

## 2022-04-21 DIAGNOSIS — I480 Paroxysmal atrial fibrillation: Secondary | ICD-10-CM | POA: Diagnosis not present

## 2022-04-21 DIAGNOSIS — R5383 Other fatigue: Secondary | ICD-10-CM

## 2022-04-21 DIAGNOSIS — A419 Sepsis, unspecified organism: Secondary | ICD-10-CM | POA: Diagnosis not present

## 2022-04-21 DIAGNOSIS — Z20822 Contact with and (suspected) exposure to covid-19: Secondary | ICD-10-CM | POA: Insufficient documentation

## 2022-04-21 DIAGNOSIS — I4891 Unspecified atrial fibrillation: Secondary | ICD-10-CM | POA: Diagnosis not present

## 2022-04-21 LAB — URINALYSIS, ROUTINE W REFLEX MICROSCOPIC
Bilirubin Urine: NEGATIVE
Glucose, UA: NEGATIVE mg/dL
Hgb urine dipstick: NEGATIVE
Ketones, ur: NEGATIVE mg/dL
Leukocytes,Ua: NEGATIVE
Nitrite: NEGATIVE
Protein, ur: NEGATIVE mg/dL
Specific Gravity, Urine: 1.01 (ref 1.005–1.030)
pH: 6 (ref 5.0–8.0)

## 2022-04-21 LAB — CBC WITH DIFFERENTIAL/PLATELET
Abs Immature Granulocytes: 0.04 10*3/uL (ref 0.00–0.07)
Basophils Absolute: 0 10*3/uL (ref 0.0–0.1)
Basophils Relative: 1 %
Eosinophils Absolute: 0.2 10*3/uL (ref 0.0–0.5)
Eosinophils Relative: 3 %
HCT: 48.6 % (ref 39.0–52.0)
Hemoglobin: 16.6 g/dL (ref 13.0–17.0)
Immature Granulocytes: 1 %
Lymphocytes Relative: 14 %
Lymphs Abs: 1 10*3/uL (ref 0.7–4.0)
MCH: 29.9 pg (ref 26.0–34.0)
MCHC: 34.2 g/dL (ref 30.0–36.0)
MCV: 87.6 fL (ref 80.0–100.0)
Monocytes Absolute: 0.9 10*3/uL (ref 0.1–1.0)
Monocytes Relative: 13 %
Neutro Abs: 4.7 10*3/uL (ref 1.7–7.7)
Neutrophils Relative %: 68 %
Platelets: 234 10*3/uL (ref 150–400)
RBC: 5.55 MIL/uL (ref 4.22–5.81)
RDW: 12.8 % (ref 11.5–15.5)
WBC: 6.8 10*3/uL (ref 4.0–10.5)
nRBC: 0 % (ref 0.0–0.2)

## 2022-04-21 LAB — LACTIC ACID, PLASMA
Lactic Acid, Venous: 2.1 mmol/L (ref 0.5–1.9)
Lactic Acid, Venous: 1.2 mmol/L (ref 0.5–1.9)

## 2022-04-21 LAB — COMPREHENSIVE METABOLIC PANEL
ALT: 16 U/L (ref 0–44)
AST: 24 U/L (ref 15–41)
Albumin: 3.6 g/dL (ref 3.5–5.0)
Alkaline Phosphatase: 79 U/L (ref 38–126)
Anion gap: 8 (ref 5–15)
BUN: 24 mg/dL — ABNORMAL HIGH (ref 8–23)
CO2: 27 mmol/L (ref 22–32)
Calcium: 8.7 mg/dL — ABNORMAL LOW (ref 8.9–10.3)
Chloride: 103 mmol/L (ref 98–111)
Creatinine, Ser: 1.3 mg/dL — ABNORMAL HIGH (ref 0.61–1.24)
GFR, Estimated: 53 mL/min — ABNORMAL LOW (ref >60)
Glucose, Bld: 127 mg/dL — ABNORMAL HIGH (ref 70–99)
Potassium: 3.2 mmol/L — ABNORMAL LOW (ref 3.5–5.1)
Sodium: 138 mmol/L (ref 135–145)
Total Bilirubin: 2.5 mg/dL — ABNORMAL HIGH (ref 0.3–1.2)
Total Protein: 7.2 g/dL (ref 6.5–8.1)

## 2022-04-21 LAB — RESP PANEL BY RT-PCR (FLU A&B, COVID) ARPGX2
Influenza A by PCR: NEGATIVE
Influenza B by PCR: NEGATIVE
SARS Coronavirus 2 by RT PCR: NEGATIVE

## 2022-04-21 LAB — TROPONIN I (HIGH SENSITIVITY)
Troponin I (High Sensitivity): 8 ng/L (ref <18)
Troponin I (High Sensitivity): 6 ng/L (ref <18)

## 2022-04-21 MED ORDER — SODIUM CHLORIDE 0.9 % IV BOLUS
500.0000 mL | Freq: Once | INTRAVENOUS | Status: AC
Start: 2022-04-21 — End: 2022-04-21
  Administered 2022-04-21: 500 mL via INTRAVENOUS

## 2022-04-21 NOTE — ED Triage Notes (Signed)
Pt states that he felt "terrible" since Thursday- pt states he did have a fever at home- pt states fever was around 100- pt very vague about symptoms stating that he just feels fatigued

## 2022-04-21 NOTE — Discharge Instructions (Addendum)
Please start taking a full dose of your Eliquis in case you need electrical cardioversion early this week

## 2022-04-21 NOTE — ED Notes (Signed)
Pt transported to xray 

## 2022-04-21 NOTE — ED Provider Notes (Signed)
[facility] Provider Note   Event Date/Time   First MD Initiated Contact with Patient 04/21/22 1504     (approximate) History  Fatigue  HPI Samuel Simpson is a 88 y.o. male  Location: Generalized Duration: 1 week prior to arrival Timing: Still with onset Severity: Moderate Quality: Fatigue Context: Patient states that over the last week he has had generalized fatigue with dyspnea on exertion Modifying factors: Exertion worsens this fatigue and is partially relieved at rest Associated Symptoms: Denies ROS: Patient currently denies any vision changes, tinnitus, difficulty speaking, facial droop, sore throat, chest pain, shortness of breath, abdominal pain, nausea/vomiting/diarrhea, dysuria, or weakness/numbness/paresthesias in any extremity   Physical Exam  Triage Vital Signs: ED Triage Vitals  Enc Vitals Group     BP 04/21/22 1339 95/64     Pulse Rate 04/21/22 1339 60     Resp 04/21/22 1339 18     Temp 04/21/22 1339 98.3 F (36.8 C)     Temp Source 04/21/22 1339 Oral     SpO2 04/21/22 1339 93 %     Weight 04/21/22 1341 213 lb (96.6 kg)     Height 04/21/22 1341 '6\' 1"'$  (1.854 m)     Head Circumference --      Peak Flow --      Pain Score 04/21/22 1341 0     Pain Loc --      Pain Edu? --      Excl. in [place]? --    Most recent vital signs: Vitals:   04/21/22 1530 04/21/22 1600  BP: (!) 127/93 (!) 127/96  Pulse: 86 90  Resp: 18 18  Temp:    SpO2: 96% 97%   General: Awake, oriented x4. CV:  Good peripheral perfusion.  Resp:  Normal effort.  Abd:  No distention.  Other:  Elderly Caucasian male laying in bed in no acute distress ED Results / Procedures / Treatments  Labs (all labs ordered are listed, but only abnormal results are displayed) Labs Reviewed  COMPREHENSIVE METABOLIC PANEL - Abnormal; Notable for the following components:      Result Value   Potassium 3.2 (*)    Glucose, Bld 127 (*)    BUN 24 (*)    Creatinine, Ser 1.30 (*)     Calcium 8.7 (*)    Total Bilirubin 2.5 (*)    GFR, Estimated 53 (*)    All other components within normal limits  LACTIC ACID, PLASMA - Abnormal; Notable for the following components:   Lactic Acid, Venous 2.1 (*)    All other components within normal limits  CULTURE, BLOOD (ROUTINE X 2)  CULTURE, BLOOD (ROUTINE X 2)  URINE CULTURE  RESP PANEL BY RT-PCR (FLU A&B, COVID) ARPGX2  CBC WITH DIFFERENTIAL/PLATELET  LACTIC ACID, PLASMA  URINALYSIS, ROUTINE W REFLEX MICROSCOPIC  TROPONIN I (HIGH SENSITIVITY)  TROPONIN I (HIGH SENSITIVITY)   EKG ED ECG REPORT I, Naaman Plummer, the attending physician, personally viewed and interpreted this ECG. Date: 04/21/2022 EKG Time: 1350 Rate: 108 Rhythm: Atrial fibrillation with rapid ventricular response QRS Axis: normal Intervals: RBBB ST/T Wave abnormalities: normal Narrative Interpretation: Atrial fibrillation with RVR.  No evidence of acute ischemia RADIOLOGY ED MD interpretation: 2 view chest x-[place] interpreted by me shows no evidence of acute abnormalities including no pneumonia, pneumothorax, or widened mediastinum -Agree with radiology assessment Official radiology report(s): DG Chest 2 View  Result Date: 04/21/2022 CLINICAL DATA:  Sepsis EXAM: CHEST - 2 VIEW COMPARISON:  Chest  x-[place] 04/18/2013 FINDINGS: Heart size and mediastinal contours are within normal limits. No suspicious pulmonary opacities identified. Lungs are mildly hyperinflated. No pleural effusion or pneumothorax visualized. No acute osseous abnormality appreciated. IMPRESSION: No acute intrathoracic process identified. Electronically Signed   By: Ofilia Neas M.D.   On: 04/21/2022 14:32   PROCEDURES: Critical Care performed: Yes, see critical care procedure note(s) .1-3 Lead EKG Interpretation  Performed by: Naaman Plummer, MD Authorized by: Naaman Plummer, MD     Interpretation: abnormal     ECG rate:  53   ECG rate assessment: bradycardic     Rhythm: sinus  bradycardia     Ectopy: none     Conduction: normal   CRITICAL CARE Performed by: Naaman Plummer  Total critical care time: 31 minutes  Critical care time was exclusive of separately billable procedures and treating other patients.  Critical care was necessary to treat or prevent imminent or life-threatening deterioration.  Critical care was time spent personally by me on the following activities: development of treatment plan with patient and/or surrogate as well as nursing, discussions with consultants, evaluation of patient's response to treatment, examination of patient, obtaining history from patient or surrogate, ordering and performing treatments and interventions, ordering and review of laboratory studies, ordering and review of radiographic studies, pulse oximetry and re-evaluation of patient's condition.  MEDICATIONS ORDERED IN ED: Medications  sodium chloride 0.9 % bolus 500 mL (0 mLs Intravenous Stopped 04/21/22 1530)  sodium chloride 0.9 % bolus 500 mL (500 mLs Intravenous New Bag/Given 04/21/22 1543)   IMPRESSION / MDM / ASSESSMENT AND PLAN / ED COURSE  I reviewed the triage vital signs and the nursing notes.                             The patient is on the cardiac monitor to evaluate for evidence of arrhythmia and/or significant heart rate changes. Patient's presentation is most consistent with severe exacerbation of chronic illness. + atrial fibrillation w/ RVR DDx: Pneumothorax, Pneumonia, Pulmonary Embolus, Tamponade, ACS, Thyrotoxicosis.  No history or evidence decompensated heart failure. Given their history and exam it is likely this patient is unlikely to spontaneously revert to a rate controlled rhythm and necessitates a thorough workup for their arrhythmia. Workup: ECG, CXR, CBC, BMP, UA, Troponin Interventions: Defer Cardioversion (uncertain historical reliability with time of onset, increased risk of thromboembolic stroke).   Reassessment: Patient maintained  NSR during multi-hour observation in ED. patient did go in and out of A-fib multiple times throughout his emergency department course however he maintained a normal rate.  Patient's initial hypotension did respond to fluid resuscitation and patient's lightheadedness resolved. Disposition: Discharge home with prompt PCP follow up and cardiology referral.   FINAL CLINICAL IMPRESSION(S) / ED DIAGNOSES   Final diagnoses:  Fatigue, unspecified type  Paroxysmal atrial fibrillation with rapid ventricular response ([place])   Rx / DC Orders   ED Discharge Orders     None      Note:  This document was prepared using Dragon voice recognition software and may include unintentional dictation errors.   Naaman Plummer, MD 04/21/22 [phone]

## 2022-04-21 NOTE — ED Provider Triage Note (Signed)
Emergency Medicine Provider Triage Evaluation Note  BHAVESH VAZQUEZ , a 88 y.o. male  was evaluated in triage.  Pt complains of "feeling terrible" x3 days. Feels fatigued, sleeping a lot. No chills. Reports fever of 100F. Recently treated for prostatitis, on cipro. Saw PCP on 6/15, started cipro for prostatitis. Today returned with hypotension and tachycardia, sent to ER for concern for sepsis. Denies dysuria or abdominal pain. No tylenol/motrin today. He is concerned that he may have covid.  Vitals at Office Visit today: BP 86/63, HR 112. Afebrile  Review of Systems  Positive: Fatigue, weakness, fever Negative: Dysuria, cough, abdominal pain  Physical Exam  There were no vitals taken for this visit. Gen:   Awake, no distress   Resp:  Normal effort  MSK:   Moves extremities without difficulty  Other:    Medical Decision Making  Medically screening exam initiated at 1:38 PM.  Appropriate orders placed.  Dunbar Ronnald Ramp was informed that the remainder of the evaluation will be completed by another provider, this initial triage assessment does not replace that evaluation, and the importance of remaining in the ED until their evaluation is complete.     Marquette Old, PA-C 04/21/22 1349

## 2022-04-23 LAB — URINE CULTURE: Culture: NO GROWTH

## 2022-04-26 DIAGNOSIS — R5381 Other malaise: Secondary | ICD-10-CM | POA: Diagnosis not present

## 2022-04-26 DIAGNOSIS — R5383 Other fatigue: Secondary | ICD-10-CM | POA: Diagnosis not present

## 2022-04-26 DIAGNOSIS — I10 Essential (primary) hypertension: Secondary | ICD-10-CM | POA: Diagnosis not present

## 2022-04-26 DIAGNOSIS — I480 Paroxysmal atrial fibrillation: Secondary | ICD-10-CM | POA: Diagnosis not present

## 2022-04-26 LAB — CULTURE, BLOOD (ROUTINE X 2)
Culture: NO GROWTH
Culture: NO GROWTH

## 2022-05-06 DIAGNOSIS — R899 Unspecified abnormal finding in specimens from other organs, systems and tissues: Secondary | ICD-10-CM | POA: Diagnosis not present

## 2022-05-14 DIAGNOSIS — I480 Paroxysmal atrial fibrillation: Secondary | ICD-10-CM | POA: Diagnosis not present

## 2022-05-14 DIAGNOSIS — I10 Essential (primary) hypertension: Secondary | ICD-10-CM | POA: Diagnosis not present

## 2022-05-14 DIAGNOSIS — R002 Palpitations: Secondary | ICD-10-CM | POA: Diagnosis not present

## 2022-05-14 DIAGNOSIS — R011 Cardiac murmur, unspecified: Secondary | ICD-10-CM | POA: Diagnosis not present

## 2022-05-14 DIAGNOSIS — R0602 Shortness of breath: Secondary | ICD-10-CM | POA: Diagnosis not present

## 2022-05-14 DIAGNOSIS — G4733 Obstructive sleep apnea (adult) (pediatric): Secondary | ICD-10-CM | POA: Diagnosis not present

## 2022-05-14 DIAGNOSIS — J449 Chronic obstructive pulmonary disease, unspecified: Secondary | ICD-10-CM | POA: Diagnosis not present

## 2022-06-12 DIAGNOSIS — R0602 Shortness of breath: Secondary | ICD-10-CM | POA: Diagnosis not present

## 2022-06-12 DIAGNOSIS — R011 Cardiac murmur, unspecified: Secondary | ICD-10-CM | POA: Diagnosis not present

## 2022-06-12 DIAGNOSIS — I10 Essential (primary) hypertension: Secondary | ICD-10-CM | POA: Diagnosis not present

## 2022-06-12 DIAGNOSIS — J449 Chronic obstructive pulmonary disease, unspecified: Secondary | ICD-10-CM | POA: Diagnosis not present

## 2022-06-12 DIAGNOSIS — J329 Chronic sinusitis, unspecified: Secondary | ICD-10-CM | POA: Diagnosis not present

## 2022-06-12 DIAGNOSIS — R42 Dizziness and giddiness: Secondary | ICD-10-CM | POA: Diagnosis not present

## 2022-06-12 DIAGNOSIS — I480 Paroxysmal atrial fibrillation: Secondary | ICD-10-CM | POA: Diagnosis not present

## 2022-06-12 DIAGNOSIS — E7849 Other hyperlipidemia: Secondary | ICD-10-CM | POA: Diagnosis not present

## 2022-06-12 DIAGNOSIS — R002 Palpitations: Secondary | ICD-10-CM | POA: Diagnosis not present

## 2022-06-12 DIAGNOSIS — G4733 Obstructive sleep apnea (adult) (pediatric): Secondary | ICD-10-CM | POA: Diagnosis not present

## 2022-07-10 DIAGNOSIS — I480 Paroxysmal atrial fibrillation: Secondary | ICD-10-CM | POA: Diagnosis not present

## 2022-07-10 DIAGNOSIS — E538 Deficiency of other specified B group vitamins: Secondary | ICD-10-CM | POA: Diagnosis not present

## 2022-07-10 DIAGNOSIS — Z79899 Other long term (current) drug therapy: Secondary | ICD-10-CM | POA: Diagnosis not present

## 2022-07-10 DIAGNOSIS — R351 Nocturia: Secondary | ICD-10-CM | POA: Diagnosis not present

## 2022-07-10 DIAGNOSIS — N401 Enlarged prostate with lower urinary tract symptoms: Secondary | ICD-10-CM | POA: Diagnosis not present

## 2022-07-10 DIAGNOSIS — M79641 Pain in right hand: Secondary | ICD-10-CM | POA: Diagnosis not present

## 2022-07-10 DIAGNOSIS — I10 Essential (primary) hypertension: Secondary | ICD-10-CM | POA: Diagnosis not present

## 2022-07-10 DIAGNOSIS — E7800 Pure hypercholesterolemia, unspecified: Secondary | ICD-10-CM | POA: Diagnosis not present

## 2022-07-10 DIAGNOSIS — E559 Vitamin D deficiency, unspecified: Secondary | ICD-10-CM | POA: Diagnosis not present

## 2022-07-10 DIAGNOSIS — R7309 Other abnormal glucose: Secondary | ICD-10-CM | POA: Diagnosis not present

## 2022-07-11 DIAGNOSIS — Z79899 Other long term (current) drug therapy: Secondary | ICD-10-CM | POA: Diagnosis not present

## 2022-07-11 DIAGNOSIS — N401 Enlarged prostate with lower urinary tract symptoms: Secondary | ICD-10-CM | POA: Diagnosis not present

## 2022-07-11 DIAGNOSIS — E7800 Pure hypercholesterolemia, unspecified: Secondary | ICD-10-CM | POA: Diagnosis not present

## 2022-07-11 DIAGNOSIS — E538 Deficiency of other specified B group vitamins: Secondary | ICD-10-CM | POA: Diagnosis not present

## 2022-07-11 DIAGNOSIS — I480 Paroxysmal atrial fibrillation: Secondary | ICD-10-CM | POA: Diagnosis not present

## 2022-07-11 DIAGNOSIS — R7309 Other abnormal glucose: Secondary | ICD-10-CM | POA: Diagnosis not present

## 2022-07-11 DIAGNOSIS — R351 Nocturia: Secondary | ICD-10-CM | POA: Diagnosis not present

## 2022-07-11 DIAGNOSIS — I10 Essential (primary) hypertension: Secondary | ICD-10-CM | POA: Diagnosis not present

## 2022-07-11 DIAGNOSIS — M79641 Pain in right hand: Secondary | ICD-10-CM | POA: Diagnosis not present

## 2022-07-11 DIAGNOSIS — E559 Vitamin D deficiency, unspecified: Secondary | ICD-10-CM | POA: Diagnosis not present

## 2022-07-28 ENCOUNTER — Emergency Department: Payer: PPO

## 2022-07-28 ENCOUNTER — Encounter: Payer: Self-pay | Admitting: Emergency Medicine

## 2022-07-28 ENCOUNTER — Emergency Department
Admission: EM | Admit: 2022-07-28 | Discharge: 2022-07-28 | Disposition: A | Discharge status: Home / Self Care | Payer: PPO | Attending: Student in an Organized Health Care Education/Training Program | Admitting: Student in an Organized Health Care Education/Training Program

## 2022-07-28 ENCOUNTER — Other Ambulatory Visit: Payer: Self-pay

## 2022-07-28 DIAGNOSIS — Z7901 Long term (current) use of anticoagulants: Secondary | ICD-10-CM | POA: Diagnosis not present

## 2022-07-28 DIAGNOSIS — S59912A Unspecified injury of left forearm, initial encounter: Secondary | ICD-10-CM | POA: Diagnosis present

## 2022-07-28 DIAGNOSIS — S5012XA Contusion of left forearm, initial encounter: Secondary | ICD-10-CM | POA: Insufficient documentation

## 2022-07-28 DIAGNOSIS — S40022A Contusion of left upper arm, initial encounter: Secondary | ICD-10-CM | POA: Diagnosis not present

## 2022-07-28 DIAGNOSIS — T148XXA Other injury of unspecified body region, initial encounter: Secondary | ICD-10-CM

## 2022-07-28 DIAGNOSIS — S5002XA Contusion of left elbow, initial encounter: Secondary | ICD-10-CM | POA: Diagnosis not present

## 2022-07-28 DIAGNOSIS — W1830XA Fall on same level, unspecified, initial encounter: Secondary | ICD-10-CM | POA: Diagnosis not present

## 2022-07-28 DIAGNOSIS — M7981 Nontraumatic hematoma of soft tissue: Secondary | ICD-10-CM | POA: Diagnosis not present

## 2022-07-28 DIAGNOSIS — M25512 Pain in left shoulder: Secondary | ICD-10-CM | POA: Diagnosis not present

## 2022-07-28 NOTE — ED Triage Notes (Signed)
Pt reports flle yesterday hurting his left shoulder. Pt also with swelling and bruising noted to left elbow but pt reports pain is in his left shoulder

## 2022-07-28 NOTE — ED Notes (Signed)
Pt has large hematoma noted to left arm. Pt states it seems to have gotten bigger.

## 2022-07-28 NOTE — ED Provider Notes (Signed)
[facility] Provider Note    Event Date/Time   First MD Initiated Contact with Patient 07/28/22 1751     (approximate)   History   Fall and Joint Swelling   HPI  Samuel Simpson is a 88 y.o. male on half dose Eliquis history of A-fib presents to the ER for evaluation of left shoulder pain and elbow pain that occurred after mechanical fall while going up stairs yesterday.  Did not hit his head denies any neck pain no chest pain no shortness of breath no nausea or vomiting no numbness or tingling.     Physical Exam   Triage Vital Signs: ED Triage Vitals  Enc Vitals Group     BP 07/28/22 1643 112/78     Pulse Rate 07/28/22 1643 72     Resp 07/28/22 1643 16     Temp 07/28/22 1643 97.8 F (36.6 C)     Temp Source 07/28/22 1643 Oral     SpO2 07/28/22 1643 98 %     Weight 07/28/22 1641 212 lb 15.4 oz (96.6 kg)     Height 07/28/22 1641 '6\' 1"'$  (1.854 m)     Head Circumference --      Peak Flow --      Pain Score 07/28/22 1758 2     Pain Loc --      Pain Edu? --      Excl. in [place]? --     Most recent vital signs: Vitals:   07/28/22 1643  BP: 112/78  Pulse: 72  Resp: 16  Temp: 97.8 F (36.6 C)  SpO2: 98%     Constitutional: Alert  Eyes: Conjunctivae are normal.  Head: Atraumatic. Nose: No congestion/rhinnorhea. Mouth/Throat: Mucous membranes are moist.   Neck: Painless ROM.  Cardiovascular:   Good peripheral circulation. Respiratory: Normal respiratory effort.  No retractions.  Gastrointestinal: Soft and nontender.  Musculoskeletal: Contusion and ecchymosis about the left forearm with hematoma on the proximal radial aspect.  Compartments are soft.  No obvious deformity.  Neurovascularly intact distally.  Small superficial abrasion on the upper arm roughly 2 cm in size. Neurologic:  MAE spontaneously. No gross focal neurologic deficits are appreciated.  Skin:  Skin is warm, dry and intact. No rash noted. Psychiatric: Mood and affect are normal.  Speech and behavior are normal.    ED Results / Procedures / Treatments   Labs (all labs ordered are listed, but only abnormal results are displayed) Labs Reviewed - No data to display   EKG     RADIOLOGY Please see ED Course for my review and interpretation.  I personally reviewed all radiographic images ordered to evaluate for the above acute complaints and reviewed radiology reports and findings.  These findings were personally discussed with the patient.  Please see medical record for radiology report.    PROCEDURES:  Critical Care performed: No  Procedures   MEDICATIONS ORDERED IN ED: Medications - No data to display   IMPRESSION / MDM / [place] / ED COURSE  I reviewed the triage vital signs and the nursing notes.                              Differential diagnosis includes, but is not limited to, fracture, hematoma, contusion, abscess, dislocation  Patient presented to the ER for evaluation of symptoms as described above.  X-Asante ordered for above differential.  His exam is consistent with significant  contusion but his compartments are soft.  Bedside ultrasound confirmed evidence of hematoma of the left forearm.  No pulsatile component does appear stable and not expanding.  He denies any presyncope or symptoms to suggest anything other than a mechanical fall.  Did not hit his head no neck pain.   Clinical Course as of 07/28/22 1927  Nancy Fetter Jul 28, 2022  1831 X-Lateef of the elbow my review and interpretation does not show any evidence of fracture or dislocation will await formal radiology report. [PR]    Clinical Course User Index [PR] Merlyn Lot, MD   X-Dylen without evidence of fracture.  Compression bandage applied.  Discussed conservative management signs and symptoms which patient should return to the ER.  FINAL CLINICAL IMPRESSION(S) / ED DIAGNOSES   Final diagnoses:  Hematoma     Rx / DC Orders   ED Discharge Orders     None         Note:  This document was prepared using Dragon voice recognition software and may include unintentional dictation errors.    Merlyn Lot, MD 07/28/22 Sharilyn Sites

## 2022-08-02 DIAGNOSIS — Z03818 Encounter for observation for suspected exposure to other biological agents ruled out: Secondary | ICD-10-CM | POA: Diagnosis not present

## 2022-08-02 DIAGNOSIS — Z7901 Long term (current) use of anticoagulants: Secondary | ICD-10-CM | POA: Diagnosis not present

## 2022-08-02 DIAGNOSIS — U071 COVID-19: Secondary | ICD-10-CM | POA: Diagnosis not present

## 2022-08-02 DIAGNOSIS — I480 Paroxysmal atrial fibrillation: Secondary | ICD-10-CM | POA: Diagnosis not present

## 2022-10-01 DIAGNOSIS — G4733 Obstructive sleep apnea (adult) (pediatric): Secondary | ICD-10-CM | POA: Diagnosis not present

## 2022-10-01 DIAGNOSIS — I10 Essential (primary) hypertension: Secondary | ICD-10-CM | POA: Diagnosis not present

## 2022-10-01 DIAGNOSIS — J449 Chronic obstructive pulmonary disease, unspecified: Secondary | ICD-10-CM | POA: Diagnosis not present

## 2022-10-01 DIAGNOSIS — R011 Cardiac murmur, unspecified: Secondary | ICD-10-CM | POA: Diagnosis not present

## 2022-10-01 DIAGNOSIS — R002 Palpitations: Secondary | ICD-10-CM | POA: Diagnosis not present

## 2022-10-01 DIAGNOSIS — R0602 Shortness of breath: Secondary | ICD-10-CM | POA: Diagnosis not present

## 2022-10-01 DIAGNOSIS — I480 Paroxysmal atrial fibrillation: Secondary | ICD-10-CM | POA: Diagnosis not present

## 2022-10-04 IMAGING — US US ABDOMEN LIMITED RUQ/ASCITES
1 series · 14 of 25 positions shown · non-contrast
Comparison: Ultrasound dated 07/04/2014.

CLINICAL DATA: 89-year-old male with elevated bilirubin.

EXAM:
ULTRASOUND ABDOMEN LIMITED RIGHT UPPER QUADRANT

[Series 1: us abdomen limited ruq (liver/gb) · 14 of 47 slices shown]
[im 1/47]
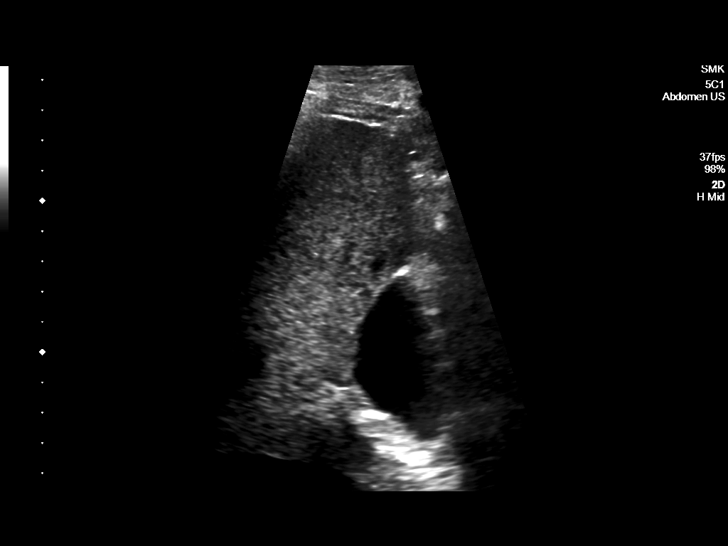
[im 4/47]
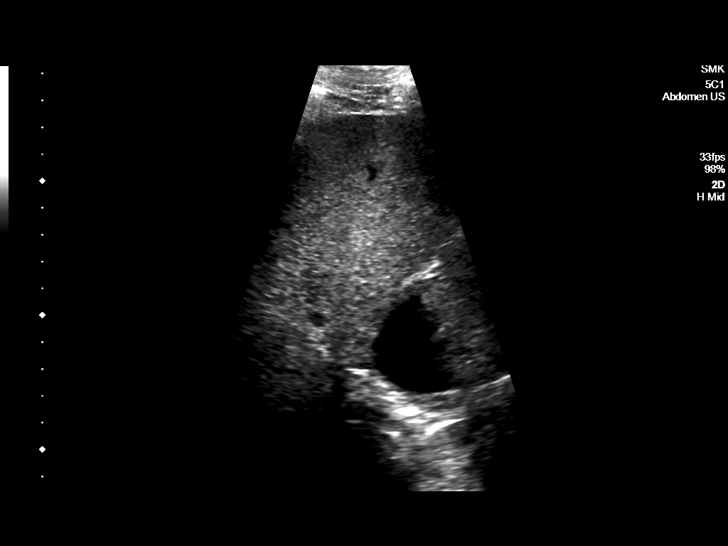
[im 8/47]
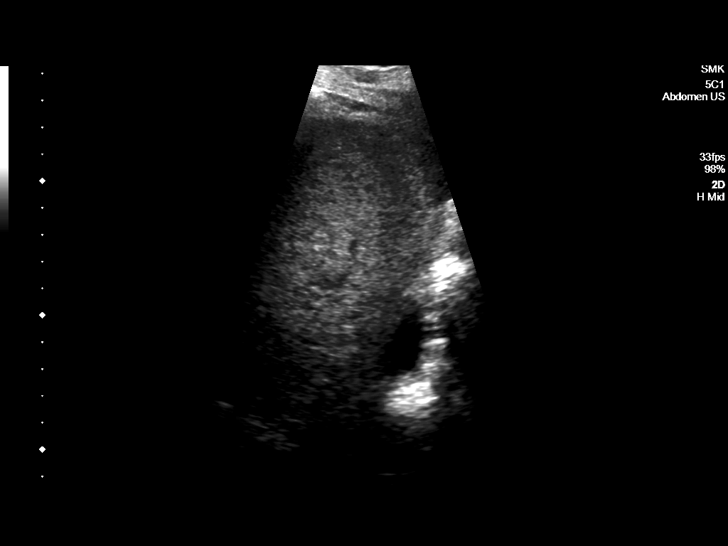
[im 12/47]
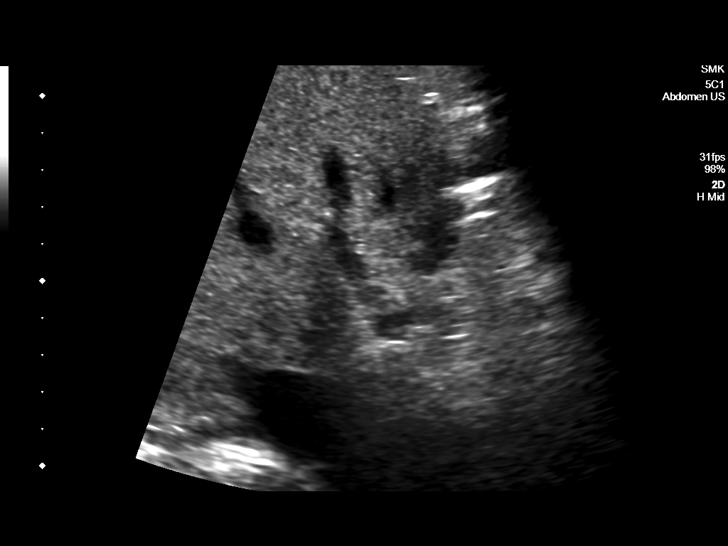
[im 16/47]
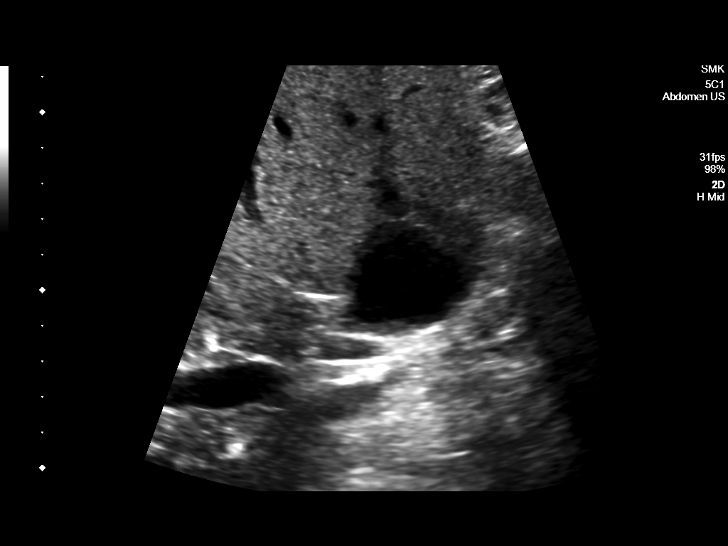
[im 18/47]
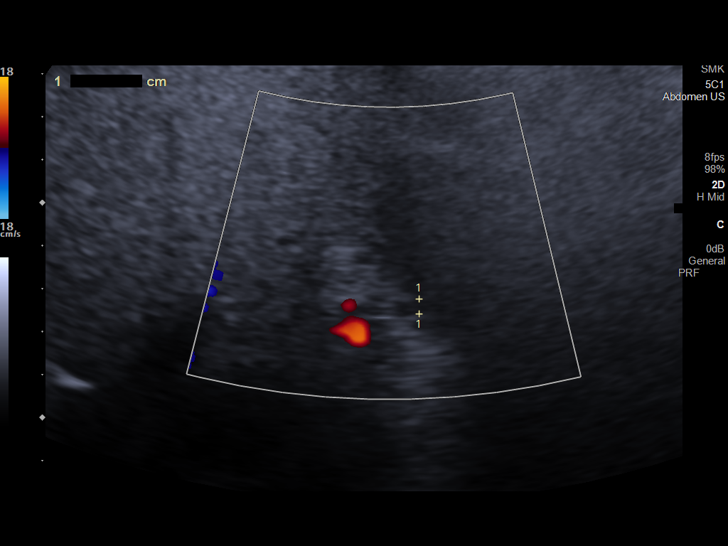
[im 22/47]
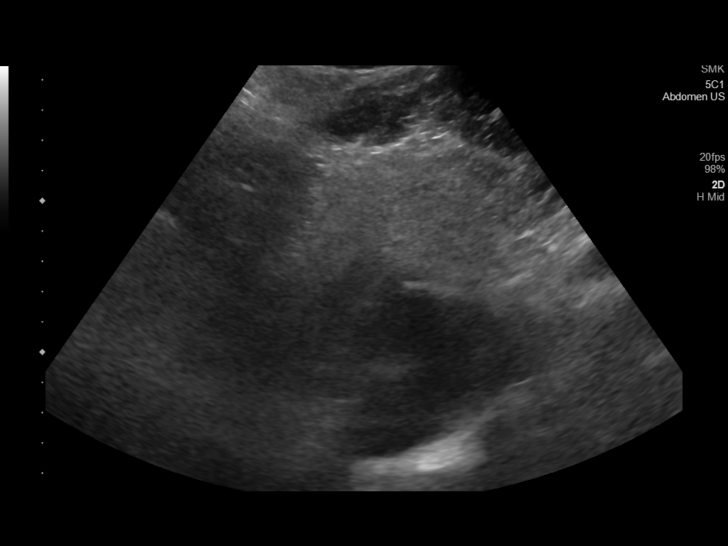
[im 25/47]
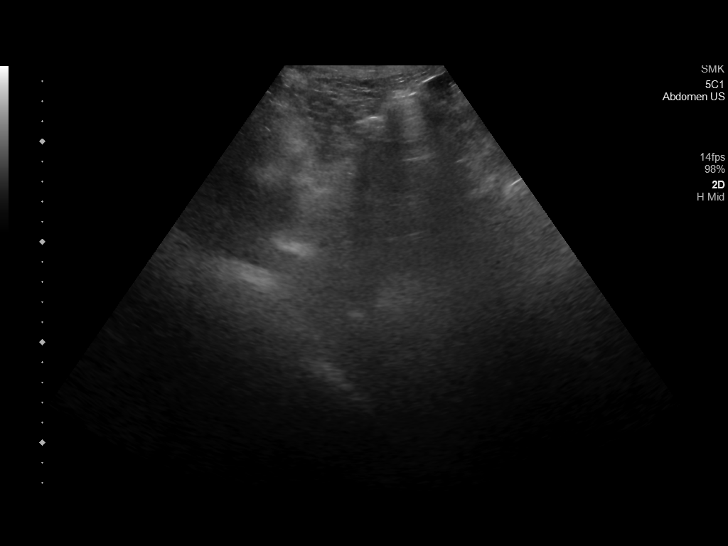
[im 29/47]
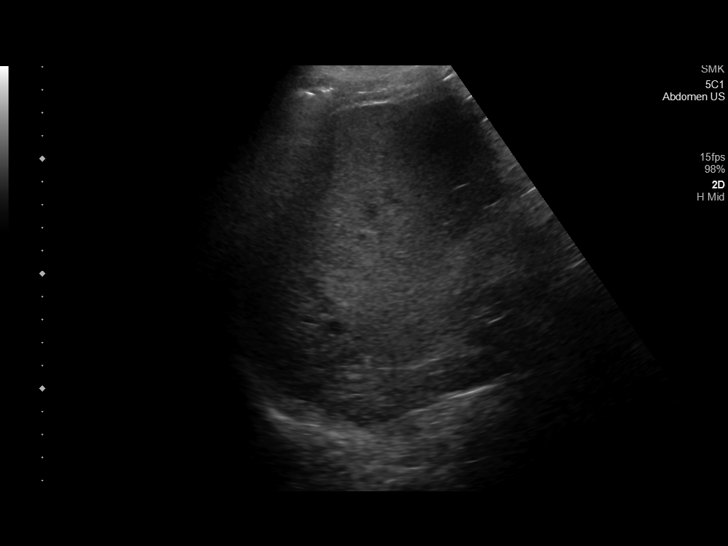
[im 31/47]
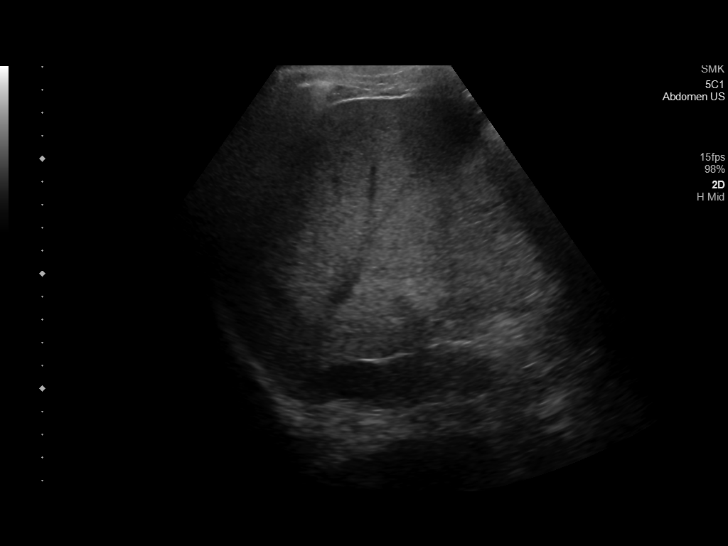
[im 35/47]
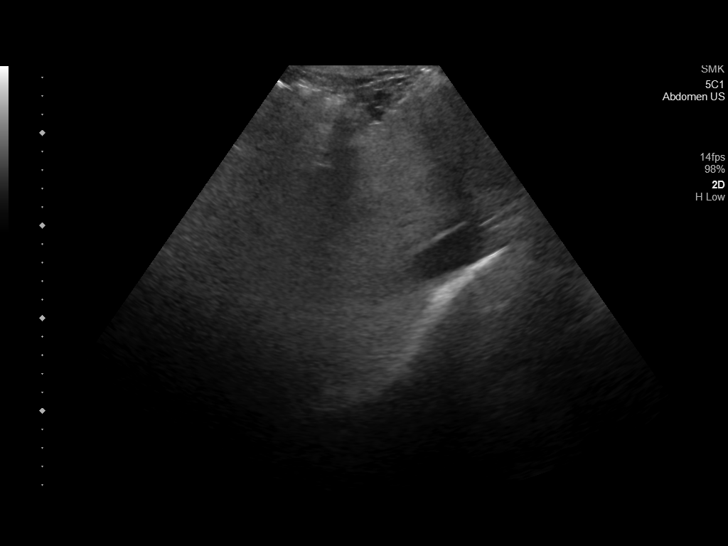
[im 39/47]
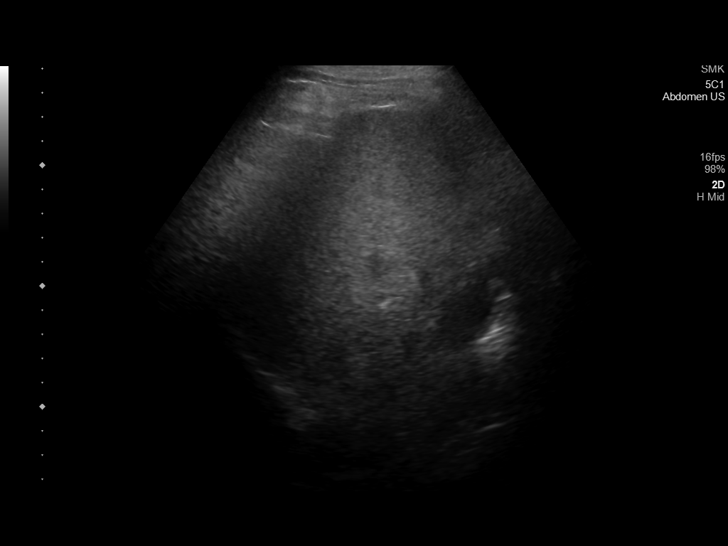
[im 43/47]
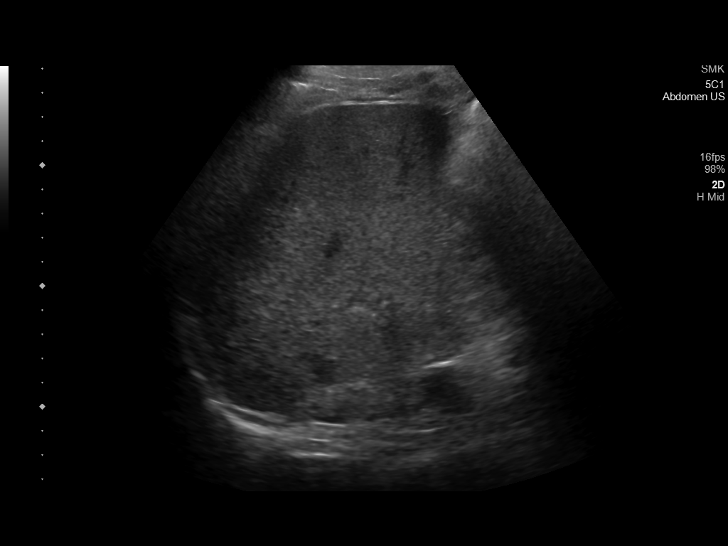
[im 47/47]
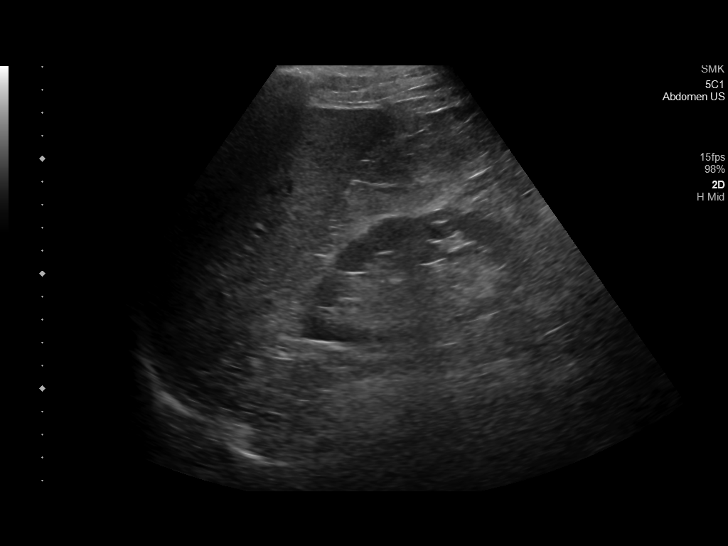

[14 of 25 positions shown; findings below may reference images not displayed]

FINDINGS: Gallbladder:

No gallstones or wall thickening visualized. No sonographic Murphy
sign noted by sonographer.

Common bile duct:

Diameter: 4 mm

Liver:

There is diffuse increased liver echogenicity most commonly seen in
the setting of fatty infiltration. Superimposed inflammation or
fibrosis is not excluded. Clinical correlation is recommended.
Portal vein is patent on color Doppler imaging with normal direction
of blood flow towards the liver.

Other: None.
IMPRESSION: Fatty liver, otherwise unremarkable right upper quadrant ultrasound.

## 2022-11-11 DIAGNOSIS — N401 Enlarged prostate with lower urinary tract symptoms: Secondary | ICD-10-CM | POA: Diagnosis not present

## 2022-11-11 DIAGNOSIS — E538 Deficiency of other specified B group vitamins: Secondary | ICD-10-CM | POA: Diagnosis not present

## 2022-11-11 DIAGNOSIS — R7303 Prediabetes: Secondary | ICD-10-CM | POA: Diagnosis not present

## 2022-11-11 DIAGNOSIS — R351 Nocturia: Secondary | ICD-10-CM | POA: Diagnosis not present

## 2022-11-11 DIAGNOSIS — J449 Chronic obstructive pulmonary disease, unspecified: Secondary | ICD-10-CM | POA: Diagnosis not present

## 2022-11-11 DIAGNOSIS — E559 Vitamin D deficiency, unspecified: Secondary | ICD-10-CM | POA: Diagnosis not present

## 2022-11-11 DIAGNOSIS — E7800 Pure hypercholesterolemia, unspecified: Secondary | ICD-10-CM | POA: Diagnosis not present

## 2022-11-11 DIAGNOSIS — I480 Paroxysmal atrial fibrillation: Secondary | ICD-10-CM | POA: Diagnosis not present

## 2022-11-11 DIAGNOSIS — I10 Essential (primary) hypertension: Secondary | ICD-10-CM | POA: Diagnosis not present

## 2022-11-11 DIAGNOSIS — R7309 Other abnormal glucose: Secondary | ICD-10-CM | POA: Diagnosis not present

## 2022-11-11 DIAGNOSIS — Z79899 Other long term (current) drug therapy: Secondary | ICD-10-CM | POA: Diagnosis not present

## 2022-11-13 DIAGNOSIS — N401 Enlarged prostate with lower urinary tract symptoms: Secondary | ICD-10-CM | POA: Diagnosis not present

## 2022-11-13 DIAGNOSIS — I480 Paroxysmal atrial fibrillation: Secondary | ICD-10-CM | POA: Diagnosis not present

## 2022-11-13 DIAGNOSIS — I10 Essential (primary) hypertension: Secondary | ICD-10-CM | POA: Diagnosis not present

## 2022-11-13 DIAGNOSIS — E538 Deficiency of other specified B group vitamins: Secondary | ICD-10-CM | POA: Diagnosis not present

## 2022-11-13 DIAGNOSIS — E7800 Pure hypercholesterolemia, unspecified: Secondary | ICD-10-CM | POA: Diagnosis not present

## 2022-11-13 DIAGNOSIS — R351 Nocturia: Secondary | ICD-10-CM | POA: Diagnosis not present

## 2022-11-13 DIAGNOSIS — R7303 Prediabetes: Secondary | ICD-10-CM | POA: Diagnosis not present

## 2022-11-13 DIAGNOSIS — E559 Vitamin D deficiency, unspecified: Secondary | ICD-10-CM | POA: Diagnosis not present

## 2022-11-13 DIAGNOSIS — R7309 Other abnormal glucose: Secondary | ICD-10-CM | POA: Diagnosis not present

## 2022-11-13 DIAGNOSIS — Z79899 Other long term (current) drug therapy: Secondary | ICD-10-CM | POA: Diagnosis not present

## 2022-11-13 DIAGNOSIS — J449 Chronic obstructive pulmonary disease, unspecified: Secondary | ICD-10-CM | POA: Diagnosis not present

## 2022-11-14 DIAGNOSIS — I480 Paroxysmal atrial fibrillation: Secondary | ICD-10-CM | POA: Diagnosis not present

## 2022-11-21 ENCOUNTER — Ambulatory Visit: Payer: PPO | Admitting: Urology

## 2022-11-22 ENCOUNTER — Ambulatory Visit: Payer: PPO | Admitting: Urology

## 2022-11-22 ENCOUNTER — Encounter: Payer: Self-pay | Admitting: Urology

## 2022-11-22 VITALS — BP 121/84 | HR 81 | Ht 73.0 in | Wt 212.0 lb

## 2022-11-22 DIAGNOSIS — N5201 Erectile dysfunction due to arterial insufficiency: Secondary | ICD-10-CM | POA: Diagnosis not present

## 2022-11-22 DIAGNOSIS — N401 Enlarged prostate with lower urinary tract symptoms: Secondary | ICD-10-CM | POA: Diagnosis not present

## 2022-11-22 DIAGNOSIS — N138 Other obstructive and reflux uropathy: Secondary | ICD-10-CM

## 2022-11-22 MED ORDER — SILDENAFIL CITRATE 100 MG PO TABS: ORAL_TABLET | ORAL | 0 refills | Status: AC

## 2022-11-22 NOTE — Progress Notes (Signed)
11/22/2022 11:21 AM   Samuel Simpson Apr 12, 1930 [id number]  Referring provider: Idelle Crouch, MD [facility] [place],  [place] [address]  Chief Complaint  Patient presents with   Erectile Dysfunction    Urologic history: 1.  BPH with lower urinary tract symptoms  2.  Erectile dysfunction  HPI: 89 y.o. male presents for annual follow-up.  Doing well since last visit No bothersome LUTS Denies dysuria, gross hematuria Denies flank, abdominal or pelvic pain Complains of ED which he states was secondary to finasteride Sildenafil partially effective; not interested in intracavernosal injections   PMH: Past Medical History:  Diagnosis Date   Arrhythmia    Atrial fibrillation (HCC)    BPH (benign prostatic hyperplasia)    Heart murmur    Hypertension    Sleep apnea     Surgical History: Past Surgical History:  Procedure Laterality Date   HERNIA REPAIR      Home Medications:  Allergies as of 11/22/2022   No Known Allergies      Medication List        Accurate as of November 22, 2022 11:21 AM. If you have any questions, ask your nurse or doctor.          STOP taking these medications    losartan-hydrochlorothiazide 50-12.5 MG tablet Commonly known as: HYZAAR Stopped by: Abbie Sons, MD       TAKE these medications    Cholecalciferol 25 MCG (1000 UT) tablet Take by mouth.   cyanocobalamin 1000 MCG tablet Take by mouth.   Eliquis 2.5 MG Tabs tablet Generic drug: apixaban Take 2.5 mg by mouth daily. What changed: Another medication with the same name was removed. Continue taking this medication, and follow the directions you see here. Changed by: Abbie Sons, MD   hydrochlorothiazide 25 MG tablet Commonly known as: HYDRODIURIL Take 25 mg by mouth 2 (two) times daily. What changed: Another medication with the same name was removed. Continue taking this medication, and follow the directions you see  here. Changed by: Abbie Sons, MD   metoprolol tartrate 25 MG tablet Commonly known as: LOPRESSOR Take 25 mg by mouth at bedtime. Take 1 1/2 tablet 2 times daily   potassium chloride 20 MEQ packet Commonly known as: KLOR-CON Take 20 mEq by mouth 2 (two) times daily. Per patient occasionally   sildenafil 20 MG tablet Commonly known as: REVATIO 2-5 tabs 1 hour prior to intercourse        Allergies: No Known Allergies  Family History: Family History  Problem Relation Age of Onset   Heart attack Father     Social History:  reports that he has never smoked. He has never used smokeless tobacco. He reports current alcohol use of about 7.0 standard drinks of alcohol per week. He reports that he does not use drugs.   Physical Exam: BP 121/84   Pulse 81   Ht '6\' 1"'$  (1.854 m)   Wt 212 lb (96.2 kg)   BMI 27.97 kg/m   Constitutional:  Alert and oriented, No acute distress. HEENT: [place] AT, moist mucus membranes.  Trachea midline, no masses. Cardiovascular: No clubbing, cyanosis, or edema. Respiratory: Normal respiratory effort, no increased work of breathing.    Assessment & Plan:    1.  BPH with LUTS Stable voiding symptoms off medication He desires to continue annual follow-up  2.  Erectile dysfunction Sildenafil refilled-100 mg   Abbie Sons, MD  [facility]  [address] [place], [place] [address] [phone]

## 2023-01-16 DIAGNOSIS — X32XXXA Exposure to sunlight, initial encounter: Secondary | ICD-10-CM | POA: Diagnosis not present

## 2023-01-16 DIAGNOSIS — C44212 Basal cell carcinoma of skin of right ear and external auricular canal: Secondary | ICD-10-CM | POA: Diagnosis not present

## 2023-01-16 DIAGNOSIS — L821 Other seborrheic keratosis: Secondary | ICD-10-CM | POA: Diagnosis not present

## 2023-01-16 DIAGNOSIS — D485 Neoplasm of uncertain behavior of skin: Secondary | ICD-10-CM | POA: Diagnosis not present

## 2023-01-16 DIAGNOSIS — L570 Actinic keratosis: Secondary | ICD-10-CM | POA: Diagnosis not present

## 2023-01-16 DIAGNOSIS — D2261 Melanocytic nevi of right upper limb, including shoulder: Secondary | ICD-10-CM | POA: Diagnosis not present

## 2023-01-16 DIAGNOSIS — Z85828 Personal history of other malignant neoplasm of skin: Secondary | ICD-10-CM | POA: Diagnosis not present

## 2023-01-16 DIAGNOSIS — D2262 Melanocytic nevi of left upper limb, including shoulder: Secondary | ICD-10-CM | POA: Diagnosis not present

## 2023-01-16 DIAGNOSIS — D2272 Melanocytic nevi of left lower limb, including hip: Secondary | ICD-10-CM | POA: Diagnosis not present

## 2023-03-03 DIAGNOSIS — C44212 Basal cell carcinoma of skin of right ear and external auricular canal: Secondary | ICD-10-CM | POA: Diagnosis not present

## 2023-03-14 DIAGNOSIS — I10 Essential (primary) hypertension: Secondary | ICD-10-CM | POA: Diagnosis not present

## 2023-03-14 DIAGNOSIS — Z79899 Other long term (current) drug therapy: Secondary | ICD-10-CM | POA: Diagnosis not present

## 2023-03-14 DIAGNOSIS — E559 Vitamin D deficiency, unspecified: Secondary | ICD-10-CM | POA: Diagnosis not present

## 2023-03-14 DIAGNOSIS — E538 Deficiency of other specified B group vitamins: Secondary | ICD-10-CM | POA: Diagnosis not present

## 2023-03-14 DIAGNOSIS — R351 Nocturia: Secondary | ICD-10-CM | POA: Diagnosis not present

## 2023-03-14 DIAGNOSIS — N401 Enlarged prostate with lower urinary tract symptoms: Secondary | ICD-10-CM | POA: Diagnosis not present

## 2023-03-14 DIAGNOSIS — I480 Paroxysmal atrial fibrillation: Secondary | ICD-10-CM | POA: Diagnosis not present

## 2023-03-14 DIAGNOSIS — Z0000 Encounter for general adult medical examination without abnormal findings: Secondary | ICD-10-CM | POA: Diagnosis not present

## 2023-03-14 DIAGNOSIS — R7303 Prediabetes: Secondary | ICD-10-CM | POA: Diagnosis not present

## 2023-03-14 DIAGNOSIS — G4733 Obstructive sleep apnea (adult) (pediatric): Secondary | ICD-10-CM | POA: Diagnosis not present

## 2023-03-14 DIAGNOSIS — E782 Mixed hyperlipidemia: Secondary | ICD-10-CM | POA: Diagnosis not present

## 2023-03-19 DIAGNOSIS — E559 Vitamin D deficiency, unspecified: Secondary | ICD-10-CM | POA: Diagnosis not present

## 2023-03-19 DIAGNOSIS — E538 Deficiency of other specified B group vitamins: Secondary | ICD-10-CM | POA: Diagnosis not present

## 2023-03-19 DIAGNOSIS — R7303 Prediabetes: Secondary | ICD-10-CM | POA: Diagnosis not present

## 2023-03-19 DIAGNOSIS — I10 Essential (primary) hypertension: Secondary | ICD-10-CM | POA: Diagnosis not present

## 2023-03-19 DIAGNOSIS — E782 Mixed hyperlipidemia: Secondary | ICD-10-CM | POA: Diagnosis not present

## 2023-03-19 DIAGNOSIS — Z79899 Other long term (current) drug therapy: Secondary | ICD-10-CM | POA: Diagnosis not present

## 2023-04-02 DIAGNOSIS — I10 Essential (primary) hypertension: Secondary | ICD-10-CM | POA: Diagnosis not present

## 2023-04-02 DIAGNOSIS — J449 Chronic obstructive pulmonary disease, unspecified: Secondary | ICD-10-CM | POA: Diagnosis not present

## 2023-04-02 DIAGNOSIS — E7849 Other hyperlipidemia: Secondary | ICD-10-CM | POA: Diagnosis not present

## 2023-04-02 DIAGNOSIS — G4730 Sleep apnea, unspecified: Secondary | ICD-10-CM | POA: Diagnosis not present

## 2023-04-02 DIAGNOSIS — I480 Paroxysmal atrial fibrillation: Secondary | ICD-10-CM | POA: Diagnosis not present

## 2023-04-08 DIAGNOSIS — G4733 Obstructive sleep apnea (adult) (pediatric): Secondary | ICD-10-CM | POA: Diagnosis not present

## 2023-04-09 DIAGNOSIS — G4733 Obstructive sleep apnea (adult) (pediatric): Secondary | ICD-10-CM | POA: Diagnosis not present

## 2023-07-24 DIAGNOSIS — H43813 Vitreous degeneration, bilateral: Secondary | ICD-10-CM | POA: Diagnosis not present

## 2023-07-24 DIAGNOSIS — H2513 Age-related nuclear cataract, bilateral: Secondary | ICD-10-CM | POA: Diagnosis not present

## 2023-08-07 DIAGNOSIS — L821 Other seborrheic keratosis: Secondary | ICD-10-CM | POA: Diagnosis not present

## 2023-08-07 DIAGNOSIS — D2262 Melanocytic nevi of left upper limb, including shoulder: Secondary | ICD-10-CM | POA: Diagnosis not present

## 2023-08-07 DIAGNOSIS — Z85828 Personal history of other malignant neoplasm of skin: Secondary | ICD-10-CM | POA: Diagnosis not present

## 2023-08-07 DIAGNOSIS — D2271 Melanocytic nevi of right lower limb, including hip: Secondary | ICD-10-CM | POA: Diagnosis not present

## 2023-08-07 DIAGNOSIS — D2261 Melanocytic nevi of right upper limb, including shoulder: Secondary | ICD-10-CM | POA: Diagnosis not present

## 2023-08-07 DIAGNOSIS — L570 Actinic keratosis: Secondary | ICD-10-CM | POA: Diagnosis not present

## 2023-08-07 DIAGNOSIS — D2272 Melanocytic nevi of left lower limb, including hip: Secondary | ICD-10-CM | POA: Diagnosis not present

## 2023-08-07 DIAGNOSIS — D225 Melanocytic nevi of trunk: Secondary | ICD-10-CM | POA: Diagnosis not present

## 2023-09-19 DIAGNOSIS — Z0000 Encounter for general adult medical examination without abnormal findings: Secondary | ICD-10-CM | POA: Diagnosis not present

## 2023-09-19 DIAGNOSIS — E782 Mixed hyperlipidemia: Secondary | ICD-10-CM | POA: Diagnosis not present

## 2023-09-19 DIAGNOSIS — E538 Deficiency of other specified B group vitamins: Secondary | ICD-10-CM | POA: Diagnosis not present

## 2023-09-19 DIAGNOSIS — I480 Paroxysmal atrial fibrillation: Secondary | ICD-10-CM | POA: Diagnosis not present

## 2023-09-19 DIAGNOSIS — I10 Essential (primary) hypertension: Secondary | ICD-10-CM | POA: Diagnosis not present

## 2023-09-19 DIAGNOSIS — Z79899 Other long term (current) drug therapy: Secondary | ICD-10-CM | POA: Diagnosis not present

## 2023-09-19 DIAGNOSIS — E559 Vitamin D deficiency, unspecified: Secondary | ICD-10-CM | POA: Diagnosis not present

## 2023-09-19 DIAGNOSIS — R7303 Prediabetes: Secondary | ICD-10-CM | POA: Diagnosis not present

## 2023-09-29 DIAGNOSIS — I10 Essential (primary) hypertension: Secondary | ICD-10-CM | POA: Diagnosis not present

## 2023-09-29 DIAGNOSIS — G4730 Sleep apnea, unspecified: Secondary | ICD-10-CM | POA: Diagnosis not present

## 2023-09-29 DIAGNOSIS — E7849 Other hyperlipidemia: Secondary | ICD-10-CM | POA: Diagnosis not present

## 2023-09-29 DIAGNOSIS — I480 Paroxysmal atrial fibrillation: Secondary | ICD-10-CM | POA: Diagnosis not present

## 2023-09-29 DIAGNOSIS — G4733 Obstructive sleep apnea (adult) (pediatric): Secondary | ICD-10-CM | POA: Diagnosis not present

## 2023-09-29 DIAGNOSIS — R002 Palpitations: Secondary | ICD-10-CM | POA: Diagnosis not present

## 2023-09-29 DIAGNOSIS — R011 Cardiac murmur, unspecified: Secondary | ICD-10-CM | POA: Diagnosis not present

## 2023-09-29 DIAGNOSIS — J449 Chronic obstructive pulmonary disease, unspecified: Secondary | ICD-10-CM | POA: Diagnosis not present

## 2023-09-29 DIAGNOSIS — R0602 Shortness of breath: Secondary | ICD-10-CM | POA: Diagnosis not present

## 2023-11-10 DIAGNOSIS — H903 Sensorineural hearing loss, bilateral: Secondary | ICD-10-CM | POA: Diagnosis not present

## 2023-11-11 IMAGING — CR DG CHEST 2V
1 series · 2 of 2 positions shown · non-contrast
Comparison: Chest x-ray 04/18/2013

CLINICAL DATA: Sepsis

EXAM:
CHEST - 2 VIEW

[Series 1: dg chest 2 view · 0.14mm/px · 2 of 2 slices shown]
[im 1/2]
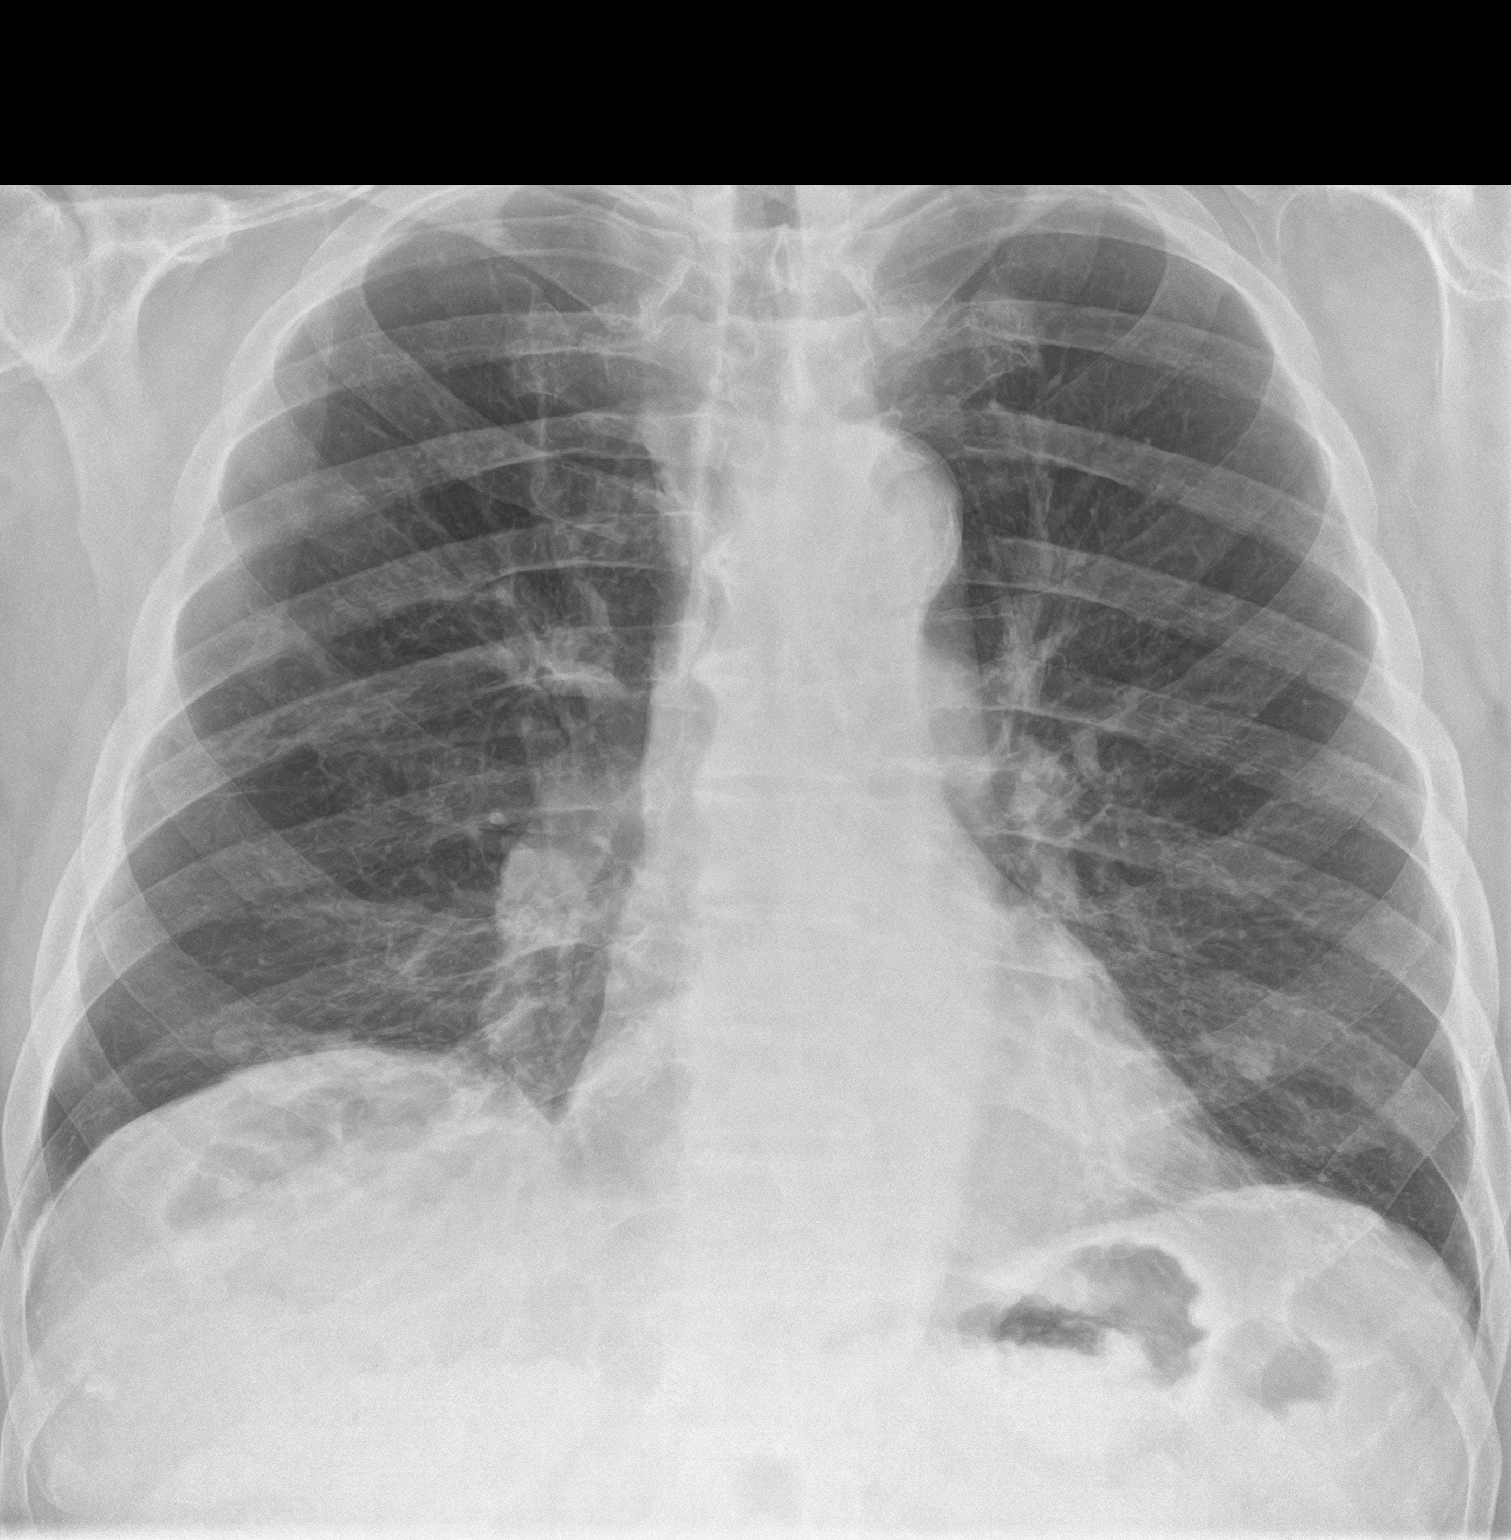
[im 2/2]
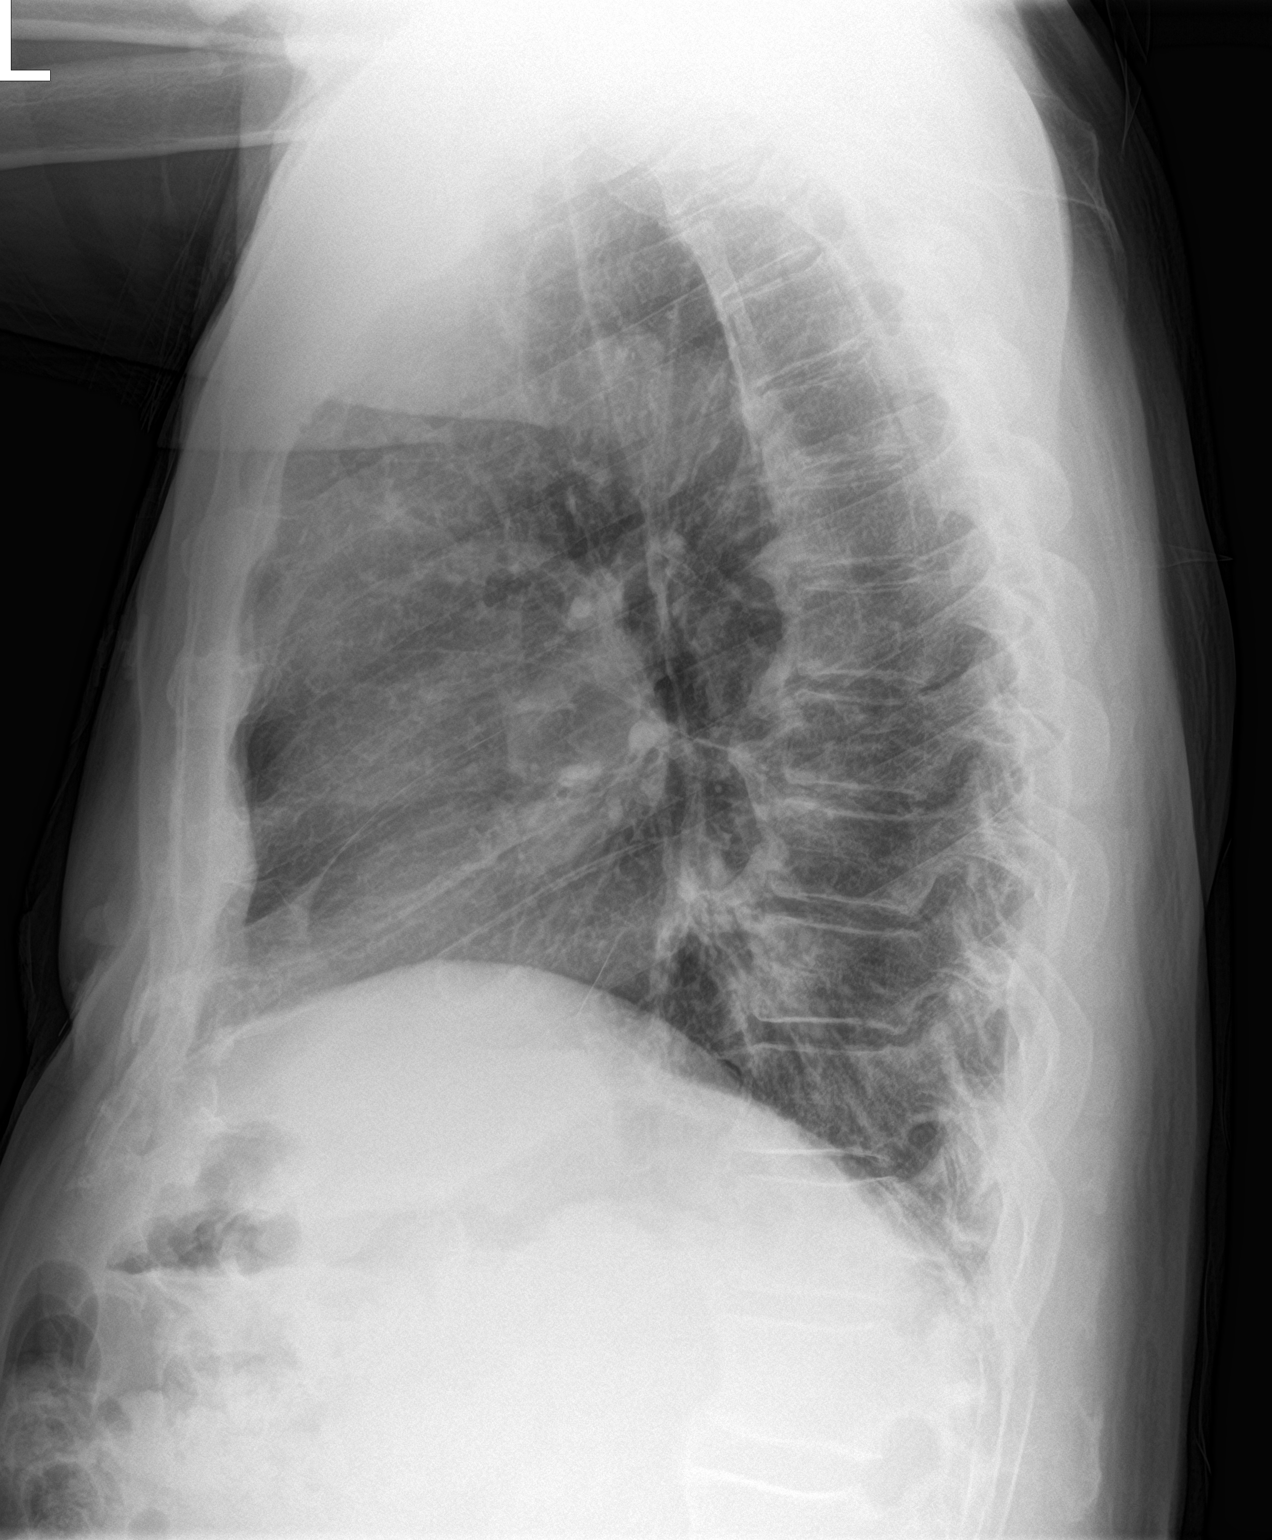

[2 of 2 positions shown; findings below may reference images not displayed]

FINDINGS: Heart size and mediastinal contours are within normal limits. No
suspicious pulmonary opacities identified. Lungs are mildly
hyperinflated.

No pleural effusion or pneumothorax visualized.

No acute osseous abnormality appreciated.
IMPRESSION: No acute intrathoracic process identified.

## 2023-11-24 ENCOUNTER — Ambulatory Visit: Payer: PPO | Admitting: Urology

## 2023-12-05 ENCOUNTER — Ambulatory Visit: Payer: PPO | Admitting: Urology

## 2023-12-05 ENCOUNTER — Encounter: Payer: Self-pay | Admitting: Urology

## 2023-12-05 VITALS — BP 111/79 | HR 94 | Ht 73.0 in | Wt 207.2 lb

## 2023-12-05 DIAGNOSIS — N529 Male erectile dysfunction, unspecified: Secondary | ICD-10-CM

## 2023-12-05 DIAGNOSIS — N401 Enlarged prostate with lower urinary tract symptoms: Secondary | ICD-10-CM | POA: Diagnosis not present

## 2023-12-05 NOTE — Patient Instructions (Addendum)
1- Venoseal compression band on [employer] or [employer]   2- Vacuum devices- [employer], or [employer] or [employer]

## 2023-12-05 NOTE — Progress Notes (Signed)
I, Maysun Anabel Bene, acting as a scribe for Riki Altes, MD., have documented all relevant documentation on the behalf of Riki Altes, MD, as directed by Riki Altes, MD while in the presence of Riki Altes, MD.  12/05/2023 12:01 PM   Rosalia Hammers Sherlean Foot 06-08-1930 [id number]  Referring provider: Marguarite Arbour, MD [address] [facility] [place],  [place] [address]  Chief Complaint  Patient presents with   Erectile Dysfunction   Urologic history: 1.  BPH with lower urinary tract symptoms   2.  Erectile dysfunction  HPI: Samuel Simpson is a 89 y.o. male presents for annual follow-up.  Still having problems with ED. He is able to get an erection with sildenafil, however has difficulty maintaining.  No bothersome LUTS  Denies dysuria, gross hematuria.   PMH: Past Medical History:  Diagnosis Date   Arrhythmia    Atrial fibrillation (HCC)    BPH (benign prostatic hyperplasia)    Heart murmur    Hypertension    Sleep apnea     Surgical History: Past Surgical History:  Procedure Laterality Date   HERNIA REPAIR      Home Medications:  Allergies as of 12/05/2023   No Known Allergies      Medication List        Accurate as of December 05, 2023 12:01 PM. If you have any questions, ask your nurse or doctor.          STOP taking these medications    Cholecalciferol 25 MCG (1000 UT) tablet   potassium chloride 20 MEQ packet Commonly known as: KLOR-CON   sildenafil 20 MG tablet Commonly known as: REVATIO       TAKE these medications    cyanocobalamin 1000 MCG tablet Take by mouth.   Eliquis 2.5 MG Tabs tablet Generic drug: apixaban Take 2.5 mg by mouth daily.   hydrochlorothiazide 25 MG tablet Commonly known as: HYDRODIURIL Take 25 mg by mouth 2 (two) times daily.   metoprolol tartrate 25 MG tablet Commonly known as: LOPRESSOR Take 25 mg by mouth at bedtime. Take 1 1/2 tablet 2 times daily   potassium chloride 10 MEQ  tablet Commonly known as: KLOR-CON M Take 10 mEq by mouth 2 (two) times daily.   sildenafil 100 MG tablet Commonly known as: VIAGRA Take 1 tablet 1 hour prior to intercourse.        Allergies: No Known Allergies  Family History: Family History  Problem Relation Age of Onset   Heart attack Father     Social History:  reports that he has never smoked. He has never used smokeless tobacco. He reports current alcohol use of about 7.0 standard drinks of alcohol per week. He reports that he does not use drugs.   Physical Exam: BP 111/79   Pulse 94   Ht 6\' 1"  (1.854 m)   Wt 207 lb 4 oz (94 kg)   BMI 27.34 kg/m   Constitutional:  Alert and oriented, No acute distress. HEENT: [place] AT Respiratory: Normal respiratory effort, no increased work of breathing. Psychiatric: Normal mood and affect.  Assessment & Plan:    1. Erectile dysfunction He does not desire intracavernosal injections.  He did have a friend who was using a vacuum erection device and is interested in pursuing a vacuum erection device.  We also discussed since he is able to achieve an erection and has difficulty maintaining, he may benefit from a venous compression band which  may improve and help him to maintain an erection. This would be less expensive and if not effective, he could then pursue a vacuum erection device. He was given literature and companies that sell vacuum devices.   2. BPH with LUTS No bothersome voiding symptoms. He is on no BPH medications.   Continue annual follow-up.   I have reviewed the above documentation for accuracy and completeness, and I agree with the above.   Riki Altes, MD  [facility] [address] [place], [place] [address] [phone]

## 2024-02-24 DIAGNOSIS — D3612 Benign neoplasm of peripheral nerves and autonomic nervous system, upper limb, including shoulder: Secondary | ICD-10-CM | POA: Diagnosis not present

## 2024-02-24 DIAGNOSIS — Z85828 Personal history of other malignant neoplasm of skin: Secondary | ICD-10-CM | POA: Diagnosis not present

## 2024-02-24 DIAGNOSIS — L570 Actinic keratosis: Secondary | ICD-10-CM | POA: Diagnosis not present

## 2024-02-24 DIAGNOSIS — D225 Melanocytic nevi of trunk: Secondary | ICD-10-CM | POA: Diagnosis not present

## 2024-02-24 DIAGNOSIS — D2272 Melanocytic nevi of left lower limb, including hip: Secondary | ICD-10-CM | POA: Diagnosis not present

## 2024-02-24 DIAGNOSIS — D2262 Melanocytic nevi of left upper limb, including shoulder: Secondary | ICD-10-CM | POA: Diagnosis not present

## 2024-02-24 DIAGNOSIS — D2261 Melanocytic nevi of right upper limb, including shoulder: Secondary | ICD-10-CM | POA: Diagnosis not present

## 2024-03-15 DIAGNOSIS — R351 Nocturia: Secondary | ICD-10-CM | POA: Diagnosis not present

## 2024-03-15 DIAGNOSIS — I10 Essential (primary) hypertension: Secondary | ICD-10-CM | POA: Diagnosis not present

## 2024-03-15 DIAGNOSIS — E782 Mixed hyperlipidemia: Secondary | ICD-10-CM | POA: Diagnosis not present

## 2024-03-15 DIAGNOSIS — Z0000 Encounter for general adult medical examination without abnormal findings: Secondary | ICD-10-CM | POA: Diagnosis not present

## 2024-03-15 DIAGNOSIS — R7303 Prediabetes: Secondary | ICD-10-CM | POA: Diagnosis not present

## 2024-03-15 DIAGNOSIS — N401 Enlarged prostate with lower urinary tract symptoms: Secondary | ICD-10-CM | POA: Diagnosis not present

## 2024-03-15 DIAGNOSIS — E559 Vitamin D deficiency, unspecified: Secondary | ICD-10-CM | POA: Diagnosis not present

## 2024-03-15 DIAGNOSIS — I480 Paroxysmal atrial fibrillation: Secondary | ICD-10-CM | POA: Diagnosis not present

## 2024-03-15 DIAGNOSIS — E538 Deficiency of other specified B group vitamins: Secondary | ICD-10-CM | POA: Diagnosis not present

## 2024-03-15 DIAGNOSIS — Z79899 Other long term (current) drug therapy: Secondary | ICD-10-CM | POA: Diagnosis not present

## 2024-03-18 DIAGNOSIS — R002 Palpitations: Secondary | ICD-10-CM | POA: Diagnosis not present

## 2024-03-18 DIAGNOSIS — R011 Cardiac murmur, unspecified: Secondary | ICD-10-CM | POA: Diagnosis not present

## 2024-03-18 DIAGNOSIS — I480 Paroxysmal atrial fibrillation: Secondary | ICD-10-CM | POA: Diagnosis not present

## 2024-03-18 DIAGNOSIS — G4730 Sleep apnea, unspecified: Secondary | ICD-10-CM | POA: Diagnosis not present

## 2024-03-18 DIAGNOSIS — G4733 Obstructive sleep apnea (adult) (pediatric): Secondary | ICD-10-CM | POA: Diagnosis not present

## 2024-03-18 DIAGNOSIS — J449 Chronic obstructive pulmonary disease, unspecified: Secondary | ICD-10-CM | POA: Diagnosis not present

## 2024-03-18 DIAGNOSIS — I10 Essential (primary) hypertension: Secondary | ICD-10-CM | POA: Diagnosis not present

## 2024-03-18 DIAGNOSIS — E7849 Other hyperlipidemia: Secondary | ICD-10-CM | POA: Diagnosis not present

## 2024-03-18 DIAGNOSIS — R0602 Shortness of breath: Secondary | ICD-10-CM | POA: Diagnosis not present

## 2024-07-26 DIAGNOSIS — H2513 Age-related nuclear cataract, bilateral: Secondary | ICD-10-CM | POA: Diagnosis not present

## 2024-07-26 DIAGNOSIS — H43813 Vitreous degeneration, bilateral: Secondary | ICD-10-CM | POA: Diagnosis not present

## 2024-09-15 DIAGNOSIS — Z79899 Other long term (current) drug therapy: Secondary | ICD-10-CM | POA: Diagnosis not present

## 2024-09-15 DIAGNOSIS — E559 Vitamin D deficiency, unspecified: Secondary | ICD-10-CM | POA: Diagnosis not present

## 2024-09-15 DIAGNOSIS — E782 Mixed hyperlipidemia: Secondary | ICD-10-CM | POA: Diagnosis not present

## 2024-09-15 DIAGNOSIS — E538 Deficiency of other specified B group vitamins: Secondary | ICD-10-CM | POA: Diagnosis not present

## 2024-09-15 DIAGNOSIS — R7303 Prediabetes: Secondary | ICD-10-CM | POA: Diagnosis not present

## 2024-09-15 DIAGNOSIS — N401 Enlarged prostate with lower urinary tract symptoms: Secondary | ICD-10-CM | POA: Diagnosis not present

## 2024-09-15 DIAGNOSIS — I10 Essential (primary) hypertension: Secondary | ICD-10-CM | POA: Diagnosis not present

## 2024-09-15 DIAGNOSIS — I480 Paroxysmal atrial fibrillation: Secondary | ICD-10-CM | POA: Diagnosis not present

## 2024-09-15 DIAGNOSIS — R351 Nocturia: Secondary | ICD-10-CM | POA: Diagnosis not present

## 2024-09-20 DIAGNOSIS — Z79899 Other long term (current) drug therapy: Secondary | ICD-10-CM | POA: Diagnosis not present

## 2024-09-20 DIAGNOSIS — I480 Paroxysmal atrial fibrillation: Secondary | ICD-10-CM | POA: Diagnosis not present

## 2024-09-20 DIAGNOSIS — I10 Essential (primary) hypertension: Secondary | ICD-10-CM | POA: Diagnosis not present

## 2024-09-20 DIAGNOSIS — E782 Mixed hyperlipidemia: Secondary | ICD-10-CM | POA: Diagnosis not present

## 2024-09-20 DIAGNOSIS — R7303 Prediabetes: Secondary | ICD-10-CM | POA: Diagnosis not present

## 2024-09-24 DIAGNOSIS — R002 Palpitations: Secondary | ICD-10-CM | POA: Diagnosis not present

## 2024-09-24 DIAGNOSIS — E7849 Other hyperlipidemia: Secondary | ICD-10-CM | POA: Diagnosis not present

## 2024-09-24 DIAGNOSIS — J449 Chronic obstructive pulmonary disease, unspecified: Secondary | ICD-10-CM | POA: Diagnosis not present

## 2024-09-24 DIAGNOSIS — G4733 Obstructive sleep apnea (adult) (pediatric): Secondary | ICD-10-CM | POA: Diagnosis not present

## 2024-09-24 DIAGNOSIS — R0602 Shortness of breath: Secondary | ICD-10-CM | POA: Diagnosis not present

## 2024-09-24 DIAGNOSIS — G4730 Sleep apnea, unspecified: Secondary | ICD-10-CM | POA: Diagnosis not present

## 2024-09-24 DIAGNOSIS — I480 Paroxysmal atrial fibrillation: Secondary | ICD-10-CM | POA: Diagnosis not present

## 2024-09-24 DIAGNOSIS — I10 Essential (primary) hypertension: Secondary | ICD-10-CM | POA: Diagnosis not present

## 2024-09-24 DIAGNOSIS — R011 Cardiac murmur, unspecified: Secondary | ICD-10-CM | POA: Diagnosis not present

## 2024-12-01 ENCOUNTER — Ambulatory Visit: Payer: PPO | Admitting: Urology

## 2024-12-01 ENCOUNTER — Encounter: Payer: Self-pay | Admitting: Urology

## 2024-12-01 VITALS — BP 131/87 | HR 71 | Ht 73.0 in | Wt 210.0 lb

## 2024-12-01 DIAGNOSIS — N5201 Erectile dysfunction due to arterial insufficiency: Secondary | ICD-10-CM

## 2024-12-01 NOTE — Progress Notes (Signed)
" ° °  12/01/2024 12:44 PM   Riven VEAR Gay 07/05/1930 [id number]  Referring provider: Auston Reyes BIRCH, MD [address] [facility] [place],  [place] [address]  Chief Complaint  Patient presents with   Erectile Dysfunction    HPI: Samuel Simpson is a 89 y.o. male presents for annual follow-up.  At last year's visit we had discussed a vacuum erection device however he elected not to pursue.  He was not interested in intracavernosal injections.  Still using sildenafil  on occasions and achieves an erection but has difficulty maintaining. No bothersome LUTS.   PMH: Past Medical History:  Diagnosis Date   Arrhythmia    Atrial fibrillation (HCC)    BPH (benign prostatic hyperplasia)    Heart murmur    Hypertension    Sleep apnea     Surgical History: Past Surgical History:  Procedure Laterality Date   HERNIA REPAIR      Home Medications:  Allergies as of 12/01/2024   No Known Allergies      Medication List        Accurate as of December 01, 2024 12:44 PM. If you have any questions, ask your nurse or doctor.          cyanocobalamin  1000 MCG tablet Take by mouth.   Eliquis 2.5 MG Tabs tablet Generic drug: apixaban Take 2.5 mg by mouth daily.   hydrochlorothiazide 25 MG tablet Commonly known as: HYDRODIURIL Take 25 mg by mouth 2 (two) times daily.   metoprolol tartrate 25 MG tablet Commonly known as: LOPRESSOR Take 25 mg by mouth at bedtime. Take 1 1/2 tablet 2 times daily   potassium chloride 10 MEQ tablet Commonly known as: KLOR-CON M Take 10 mEq by mouth 2 (two) times daily.   sildenafil  100 MG tablet Commonly known as: VIAGRA  Take 1 tablet 1 hour prior to intercourse.        Allergies: Allergies[1]  Family History: Family History  Problem Relation Age of Onset   Heart attack Father     Social History:  reports that he has never smoked. He has never used smokeless tobacco. He reports current alcohol use of about 7.0 standard  drinks of alcohol per week. He reports that he does not use drugs.   Physical Exam: BP 131/87   Pulse 71   Ht 6' 1 (1.854 m)   Wt 210 lb (95.3 kg)   BMI 27.71 kg/m   Constitutional:  Alert, No acute distress. HEENT: [place] AT Respiratory: Normal respiratory effort, no increased work of breathing. Psychiatric: Normal mood and affect.   Assessment & Plan:    1.  Erectile dysfunction Stable Since he can achieve an erection with sildenafil  but has difficulty maintaining I initially recommended a venous compression band.  He was given literature on VenoSeal Desires to continue annual follow-up   Samuel JAYSON Barba, MD  [facility] [address] [place], [place] [address] [phone]    [1] No Known Allergies  "

## 2025-12-02 ENCOUNTER — Ambulatory Visit: Admitting: Urology
